# Patient Record
Sex: Female | Born: 1955 | Race: White | Hispanic: No | Marital: Married | Smoking: Never smoker
Health system: Southern US, Community
[De-identification: ages and names within clinical notes are randomized; demographics above are authoritative.]

## PROBLEM LIST (undated history)

## (undated) DIAGNOSIS — A630 Anogenital (venereal) warts: Secondary | ICD-10-CM

## (undated) HISTORY — DX: Anogenital (venereal) warts: A63.0

---
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
## 2001-07-26 ENCOUNTER — Other Ambulatory Visit: Admission: RE | Admit: 2001-07-26 | Discharge: 2001-07-26 | Payer: Self-pay | Admitting: Internal Medicine

## 2002-08-03 ENCOUNTER — Other Ambulatory Visit: Admission: RE | Admit: 2002-08-03 | Discharge: 2002-08-03 | Payer: Self-pay | Admitting: Internal Medicine

## 2002-08-11 ENCOUNTER — Encounter: Admission: RE | Admit: 2002-08-11 | Discharge: 2002-08-11 | Payer: Self-pay | Admitting: Internal Medicine

## 2002-08-11 ENCOUNTER — Encounter: Payer: Self-pay | Admitting: Internal Medicine

## 2004-03-10 ENCOUNTER — Encounter: Admission: RE | Admit: 2004-03-10 | Discharge: 2004-03-10 | Payer: Self-pay | Admitting: Internal Medicine

## 2005-03-23 ENCOUNTER — Encounter: Admission: RE | Admit: 2005-03-23 | Discharge: 2005-03-23 | Payer: Self-pay | Admitting: Internal Medicine

## 2006-04-27 ENCOUNTER — Encounter: Admission: RE | Admit: 2006-04-27 | Discharge: 2006-04-27 | Payer: Self-pay | Admitting: Internal Medicine

## 2007-05-02 ENCOUNTER — Encounter: Admission: RE | Admit: 2007-05-02 | Discharge: 2007-05-02 | Payer: Self-pay | Admitting: Internal Medicine

## 2008-05-02 ENCOUNTER — Encounter: Admission: RE | Admit: 2008-05-02 | Discharge: 2008-05-02 | Payer: Self-pay | Admitting: Internal Medicine

## 2009-04-11 DIAGNOSIS — E559 Vitamin D deficiency, unspecified: Secondary | ICD-10-CM | POA: Insufficient documentation

## 2009-04-11 DIAGNOSIS — E7800 Pure hypercholesterolemia, unspecified: Secondary | ICD-10-CM | POA: Insufficient documentation

## 2009-05-03 ENCOUNTER — Encounter: Admission: RE | Admit: 2009-05-03 | Discharge: 2009-05-03 | Payer: Self-pay | Admitting: Internal Medicine

## 2010-05-05 ENCOUNTER — Encounter: Admission: RE | Admit: 2010-05-05 | Discharge: 2010-05-05 | Payer: Self-pay | Admitting: Internal Medicine

## 2011-04-03 ENCOUNTER — Other Ambulatory Visit: Payer: Self-pay | Admitting: Internal Medicine

## 2011-04-03 DIAGNOSIS — Z1231 Encounter for screening mammogram for malignant neoplasm of breast: Secondary | ICD-10-CM

## 2011-05-07 ENCOUNTER — Ambulatory Visit
Admission: RE | Admit: 2011-05-07 | Discharge: 2011-05-07 | Disposition: A | Discharge status: Home / Self Care | Payer: 59 | Source: Ambulatory Visit | Attending: Internal Medicine | Admitting: Internal Medicine

## 2011-05-07 DIAGNOSIS — Z1231 Encounter for screening mammogram for malignant neoplasm of breast: Secondary | ICD-10-CM

## 2012-03-29 ENCOUNTER — Other Ambulatory Visit: Payer: Self-pay | Admitting: Internal Medicine

## 2012-03-29 DIAGNOSIS — Z1231 Encounter for screening mammogram for malignant neoplasm of breast: Secondary | ICD-10-CM

## 2012-05-09 ENCOUNTER — Ambulatory Visit
Admission: RE | Admit: 2012-05-09 | Discharge: 2012-05-09 | Disposition: A | Discharge status: Home / Self Care | Payer: 59 | Source: Ambulatory Visit | Attending: Internal Medicine | Admitting: Internal Medicine

## 2012-05-09 ENCOUNTER — Ambulatory Visit: Payer: 59

## 2012-05-09 DIAGNOSIS — Z1231 Encounter for screening mammogram for malignant neoplasm of breast: Secondary | ICD-10-CM

## 2013-04-03 ENCOUNTER — Other Ambulatory Visit: Payer: Self-pay

## 2013-04-03 DIAGNOSIS — Z1231 Encounter for screening mammogram for malignant neoplasm of breast: Secondary | ICD-10-CM

## 2013-05-10 ENCOUNTER — Ambulatory Visit
Admission: RE | Admit: 2013-05-10 | Discharge: 2013-05-10 | Disposition: A | Discharge status: Home / Self Care | Payer: 59 | Source: Ambulatory Visit

## 2013-05-10 DIAGNOSIS — Z1231 Encounter for screening mammogram for malignant neoplasm of breast: Secondary | ICD-10-CM

## 2014-04-03 ENCOUNTER — Other Ambulatory Visit: Payer: Self-pay

## 2014-04-03 DIAGNOSIS — Z1231 Encounter for screening mammogram for malignant neoplasm of breast: Secondary | ICD-10-CM

## 2014-05-11 ENCOUNTER — Encounter (INDEPENDENT_AMBULATORY_CARE_PROVIDER_SITE_OTHER): Payer: Self-pay

## 2014-05-11 ENCOUNTER — Ambulatory Visit
Admission: RE | Admit: 2014-05-11 | Discharge: 2014-05-11 | Disposition: A | Discharge status: Home / Self Care | Payer: 59 | Source: Ambulatory Visit

## 2014-05-11 DIAGNOSIS — Z1231 Encounter for screening mammogram for malignant neoplasm of breast: Secondary | ICD-10-CM

## 2015-04-11 ENCOUNTER — Other Ambulatory Visit: Payer: Self-pay

## 2015-04-11 DIAGNOSIS — Z1231 Encounter for screening mammogram for malignant neoplasm of breast: Secondary | ICD-10-CM

## 2015-05-13 ENCOUNTER — Ambulatory Visit
Admission: RE | Admit: 2015-05-13 | Discharge: 2015-05-13 | Disposition: A | Discharge status: Home / Self Care | Payer: 59 | Source: Ambulatory Visit

## 2015-05-13 DIAGNOSIS — Z1231 Encounter for screening mammogram for malignant neoplasm of breast: Secondary | ICD-10-CM

## 2015-07-02 DIAGNOSIS — R85612 Low grade squamous intraepithelial lesion on cytologic smear of anus (LGSIL): Secondary | ICD-10-CM | POA: Insufficient documentation

## 2016-04-08 ENCOUNTER — Other Ambulatory Visit: Payer: Self-pay | Admitting: Internal Medicine

## 2016-04-08 DIAGNOSIS — Z1231 Encounter for screening mammogram for malignant neoplasm of breast: Secondary | ICD-10-CM

## 2016-05-25 ENCOUNTER — Ambulatory Visit
Admission: RE | Admit: 2016-05-25 | Discharge: 2016-05-25 | Disposition: A | Discharge status: Home / Self Care | Payer: 59 | Source: Ambulatory Visit | Attending: Internal Medicine | Admitting: Internal Medicine

## 2016-05-25 DIAGNOSIS — Z1231 Encounter for screening mammogram for malignant neoplasm of breast: Secondary | ICD-10-CM

## 2016-10-27 ENCOUNTER — Encounter: Payer: Self-pay | Admitting: Obstetrics & Gynecology

## 2016-11-06 ENCOUNTER — Ambulatory Visit (INDEPENDENT_AMBULATORY_CARE_PROVIDER_SITE_OTHER): Payer: BLUE CROSS/BLUE SHIELD | Admitting: Obstetrics & Gynecology

## 2016-11-06 ENCOUNTER — Encounter: Payer: Self-pay | Admitting: Obstetrics & Gynecology

## 2016-11-06 VITALS — BP 136/82 | Ht 64.0 in | Wt 139.0 lb

## 2016-11-06 DIAGNOSIS — R87611 Atypical squamous cells cannot exclude high grade squamous intraepithelial lesion on cytologic smear of cervix (ASC-H): Secondary | ICD-10-CM | POA: Diagnosis not present

## 2016-11-06 DIAGNOSIS — N952 Postmenopausal atrophic vaginitis: Secondary | ICD-10-CM

## 2016-11-06 DIAGNOSIS — Z01411 Encounter for gynecological examination (general) (routine) with abnormal findings: Secondary | ICD-10-CM

## 2016-11-06 NOTE — Patient Instructions (Signed)
Normal [facility]/Gyn exam except Atrophic vaginitis due to Menopause.  KY as needed.  Pap reflex pending. If pap wnl, repeat in 1 year at [facility]/Gyn exam with me.  Will repeat screening Mammo 04/2017.

## 2016-11-06 NOTE — Addendum Note (Signed)
Addended by: Thurnell Garbe A on: 11/06/2016 10:06 AM   Modules accepted: Orders

## 2016-11-06 NOTE — Progress Notes (Signed)
    Karina Wise 25-Jun-1956 [id number]   History:    61 y.o.  for annual gyn exam, established patient G0 Married, retired.  Menopause.  No HRT.  No PMB.  No pelvic pain.  H/O ASCUS/HGSIL not r/o with HR HPV pos.  Colpo 09/2015 No dysplasia.  Last pap wnl/HPV HR neg 06/2016.  Vaginal dryness, but declines Estrogen therapy.  Breasts wnl.    Past medical history,surgical history, family history and social history were all reviewed and documented in the EPIC chart.  Gynecologic History No LMP recorded. Patient is postmenopausal. Contraception: post menopausal status Last Pap: 06/2016. Results were: normal Last mammogram: 04/2016. Results were: normal  Obstetric History OB History  Gravida Para Term Preterm AB Living  0 0 0 0 0 0  SAB TAB Ectopic Multiple Live Births  0 0 0 0 0         ROS: A ROS was performed and pertinent positives and negatives are included in the history.  GENERAL: No fevers or chills. HEENT: No change in vision, no earache, sore throat or sinus congestion. NECK: No pain or stiffness. CARDIOVASCULAR: No chest pain or pressure. No palpitations. PULMONARY: No shortness of breath, cough or wheeze. GASTROINTESTINAL: No abdominal pain, nausea, vomiting or diarrhea, melena or bright red blood per rectum. GENITOURINARY: No urinary frequency, urgency, hesitancy or dysuria. MUSCULOSKELETAL: No joint or muscle pain, no back pain, no recent trauma. DERMATOLOGIC: No rash, no itching, no lesions. ENDOCRINE: No polyuria, polydipsia, no heat or cold intolerance. No recent change in weight. HEMATOLOGICAL: No anemia or easy bruising or bleeding. NEUROLOGIC: No headache, seizures, numbness, tingling or weakness. PSYCHIATRIC: No depression, no loss of interest in normal activity or change in sleep pattern.     Exam:   BP 136/82   Ht 5\' 4"  (1.626 m)   Wt 139 lb (63 kg)   BMI 23.86 kg/m   Body mass index is 23.86 kg/m.  General appearance : Well developed well nourished female.  No acute distress HEENT: Eyes: no retinal hemorrhage or exudates,  Neck supple, trachea midline, no carotid bruits, no thyroidmegaly Lungs: Clear to auscultation, no rhonchi or wheezes, or rib retractions  Heart: Regular rate and rhythm, no murmurs or gallops Breast:Examined in sitting and supine position were symmetrical in appearance, no palpable masses or tenderness,  no skin retraction, no nipple inversion, no nipple discharge, no skin discoloration, no axillary or supraclavicular lymphadenopathy Abdomen: no palpable masses or tenderness, no rebound or guarding Extremities: no edema or skin discoloration or tenderness  Pelvic:  Bartholin, Urethra, Skene Glands: Within normal limits             Vagina: No gross lesions or discharge  Cervix: No gross lesions or discharge  Uterus  AV, normal size, shape and consistency, non-tender and mobile  Adnexa  Without masses or tenderness  Anus and perineum  normal      Assessment/Plan:  61 y.o. female for annual exam.  1. Encounter for gynecological examination with abnormal finding Atrophic Vaginitis.  Declines Estrogen therapy.  KY gel PRN.  Schedule Mammo 04/2017.  2. Post-menopausal atrophic vaginitis KY gel PRN.  3. Cytologic smear of cervix showing atypical squamous cells, cannot exclude high grade squamous intraepithelial lesion (HGSIL) Last pap wnl/HPV HR neg 06/2016.  If pap wnl today, will repeat at [facility] in 1 yr.  Pap Reflex today.  Counseling on above problems >50% x 15 min.  Princess Bruins MD, 9:31 AM 11/06/2016

## 2016-11-10 ENCOUNTER — Encounter: Payer: Self-pay | Admitting: Obstetrics & Gynecology

## 2016-11-10 LAB — PAP IG W/ RFLX HPV ASCU

## 2017-04-23 ENCOUNTER — Other Ambulatory Visit: Payer: Self-pay | Admitting: Internal Medicine

## 2017-04-23 DIAGNOSIS — Z1231 Encounter for screening mammogram for malignant neoplasm of breast: Secondary | ICD-10-CM

## 2017-05-26 ENCOUNTER — Ambulatory Visit
Admission: RE | Admit: 2017-05-26 | Discharge: 2017-05-26 | Disposition: A | Discharge status: Home / Self Care | Payer: BLUE CROSS/BLUE SHIELD | Source: Ambulatory Visit | Attending: Internal Medicine | Admitting: Internal Medicine

## 2017-05-26 DIAGNOSIS — Z1231 Encounter for screening mammogram for malignant neoplasm of breast: Secondary | ICD-10-CM

## 2017-11-09 ENCOUNTER — Ambulatory Visit (INDEPENDENT_AMBULATORY_CARE_PROVIDER_SITE_OTHER): Payer: BLUE CROSS/BLUE SHIELD | Admitting: Obstetrics & Gynecology

## 2017-11-09 ENCOUNTER — Encounter: Payer: Self-pay | Admitting: Obstetrics & Gynecology

## 2017-11-09 VITALS — BP 132/84 | Ht 64.0 in | Wt 139.0 lb

## 2017-11-09 DIAGNOSIS — Z13820 Encounter for screening for osteoporosis: Secondary | ICD-10-CM

## 2017-11-09 DIAGNOSIS — Z780 Asymptomatic menopausal state: Secondary | ICD-10-CM | POA: Diagnosis not present

## 2017-11-09 DIAGNOSIS — Z01419 Encounter for gynecological examination (general) (routine) without abnormal findings: Secondary | ICD-10-CM | POA: Diagnosis not present

## 2017-11-09 NOTE — Patient Instructions (Signed)
1. Encounter for routine gynecological examination with Papanicolaou smear of cervix Normal gynecologic exam.  Pap reflex done.  Breasts normal.  Last screening mammogram was negative October 2018.  Colonoscopy 2018.  Health labs with family physician.  Continue with regular physical activity. - Pap IG w/ reflex to HPV when ASC-U  2. Menopause present Well on no hormone replacement therapy.  No postmenopausal bleeding.  Abstinent.  3. Screening for osteoporosis Vitamin D supplements, calcium rich nutrition and regular weightbearing physical activity recommended.  Scheduling bone density here now. - DG Bone Density; Future   Karina Wise, it was a pleasure seeing you today!  I will inform you of your results as soon as they are available.   Preventing Osteoporosis, Adult Osteoporosis is a condition that causes the bones to get weaker. With osteoporosis, the bones become thinner, and the normal spaces in bone tissue become larger. This can make the bones weak and cause them to break more easily. People who have osteoporosis are more likely to break their wrist, spine, or hip. Even a minor accident or injury can be enough to break weak bones. Osteoporosis can occur with aging. Your body constantly replaces old bone tissue with new tissue. As you get older, you may lose bone tissue more quickly, or it may be replaced more slowly. Osteoporosis is more likely to develop if you have poor nutrition or do not get enough calcium or vitamin D. Other lifestyle factors can also play a role. By making some diet and lifestyle changes, you can help to keep your bones healthy and help to prevent osteoporosis. What nutrition changes can be made? Nutrition plays an important role in maintaining healthy, strong bones.  Make sure you get enough calcium every day from food or from calcium supplements. ? If you are age 37 or younger, aim to get 1,000 mg of calcium every day. ? If you are older than age 63, aim to get 1,200  mg of calcium every day.  Try to get enough vitamin D every day. ? If you are age 53 or younger, aim to get 600 international units (IU) every day. ? If you are older than age 102, aim to get 800 international units every day.  Follow a healthy diet. Eat plenty of foods that contain calcium and vitamin D. ? Calcium is in milk, cheese, yogurt, and other dairy products. Some fish and vegetables are also good sources of calcium. Many foods such as cereals and breads have had calcium added to them (are fortified). Check nutrition labels to see how much calcium is in a food or drink. ? Foods that contain vitamin D include milk, cereals, salmon, and tuna. Your body also makes vitamin D when you are out in the sun. Bare skin exposure to the sun on your face, arms, legs, or back for no more than 30 minutes a day, 2 times per week is more than enough. Beyond that, it is important to use sunblock to protect your skin from sunburn, which increases your risk for skin cancer.  What lifestyle changes can be made? Making changes in your everyday life can also play an important role in preventing osteoporosis.  Stay active and get exercise every day. Ask your health care provider what types of exercise are best for you.  Do not use any products that contain nicotine or tobacco, such as cigarettes and e-cigarettes. If you need help quitting, ask your health care provider.  Limit alcohol intake to no more than 1 drink  a day for nonpregnant women and 2 drinks a day for men. One drink equals 12 oz of beer, 5 oz of wine, or 1 oz of hard liquor.  Why are these changes important? Making these nutrition and lifestyle changes can:  Help you develop and maintain healthy, strong bones.  Prevent loss of bone mass and the problems that are caused by that loss, such as broken bones and delayed healing.  Make you feel better mentally and physically.  What can happen if changes are not made? Problems that can result  from osteoporosis can be very serious. These may include:  A higher risk of broken bones that are painful and do not heal well.  Physical malformations, such as a collapsed spine or a hunched back.  Problems with movement.  Where to find support: If you need help making changes to prevent osteoporosis, talk with your health care provider. You can ask for a referral to a diet and nutrition specialist (dietitian) and a physical therapist. Where to find more information: Learn more about osteoporosis from:  NIH Osteoporosis and Related [place]: www.niams.GolfingGoddess.com.br  U.S. Office on Women's Health: SouvenirBaseball.es.html  National Osteoporosis Foundation: ProfilePeek.ch  Summary  Osteoporosis is a condition that causes weak bones that are more likely to break.  Eating a healthy diet and making sure you get enough calcium and vitamin D can help prevent osteoporosis.  Other ways to reduce your risk of osteoporosis include getting regular exercise and avoiding alcohol and products that contain nicotine or tobacco. This information is not intended to replace advice given to you by your health care provider. Make sure you discuss any questions you have with your health care provider. Document Released: 07/28/2015 Document Revised: 03/23/2016 Document Reviewed: 03/23/2016 Elsevier Interactive Patient Education  2018 [place] protect organs, store calcium, and anchor muscles. Good health habits, such as eating nutritious foods and exercising regularly, are important for maintaining healthy bones. They can also help to prevent a condition that causes bones to lose density and become weak and brittle (osteoporosis). Why is bone mass important? Bone mass refers to the amount of bone tissue that you have. The  higher your bone mass, the stronger your bones. An important step toward having healthy bones throughout life is to have strong and dense bones during childhood. A young adult who has a high bone mass is more likely to have a high bone mass later in life. Bone mass at its greatest it is called peak bone mass. A large decline in bone mass occurs in older adults. In women, it occurs about the time of menopause. During this time, it is important to practice good health habits, because if more bone is lost than what is replaced, the bones will become less healthy and more likely to break (fracture). If you find that you have a low bone mass, you may be able to prevent osteoporosis or further bone loss by changing your diet and lifestyle. How can I find out if my bone mass is low? Bone mass can be measured with an X-ray test that is called a bone mineral density (BMD) test. This test is recommended for all women who are age 52 or older. It may also be recommended for men who are age 76 or older, or for people who are more likely to develop osteoporosis due to:  Having bones that break easily.  Having a long-term disease that weakens bones, such as kidney disease or  rheumatoid arthritis.  Having menopause earlier than normal.  Taking medicine that weakens bones, such as steroids, thyroid hormones, or hormone treatment for breast cancer or prostate cancer.  Smoking.  Drinking three or more alcoholic drinks each day.  What are the nutritional recommendations for healthy bones? To have healthy bones, you need to get enough of the right minerals and vitamins. Most nutrition experts recommend getting these nutrients from the foods that you eat. Nutritional recommendations vary from person to person. Ask your health care provider what is healthy for you. Here are some general guidelines. Calcium Recommendations Calcium is the most important (essential) mineral for bone health. Most people can get enough  calcium from their diet, but supplements may be recommended for people who are at risk for osteoporosis. Good sources of calcium include:  Dairy products, such as low-fat or nonfat milk, cheese, and yogurt.  Dark green leafy vegetables, such as bok choy and broccoli.  Calcium-fortified foods, such as orange juice, cereal, bread, soy beverages, and tofu products.  Nuts, such as almonds.  Follow these recommended amounts for daily calcium intake:  Children, age 50?3: 700 mg.  Children, age 31?8: 1,000 mg.  Children, age 319?13: 1,300 mg.  Teens, age 23?18: 1,300 mg.  Adults, age 23?50: 1,000 mg.  Adults, age 32?70: ? Men: 1,000 mg. ? Women: 1,200 mg.  Adults, age 67 or older: 1,200 mg.  Pregnant and breastfeeding females: ? Teens: 1,300 mg. ? Adults: 1,000 mg.  Vitamin D Recommendations Vitamin D is the most essential vitamin for bone health. It helps the body to absorb calcium. Sunlight stimulates the skin to make vitamin D, so be sure to get enough sunlight. If you live in a cold climate or you do not get outside often, your health care provider may recommend that you take vitamin D supplements. Good sources of vitamin D in your diet include:  Egg yolks.  Saltwater fish.  Milk and cereal fortified with vitamin D.  Follow these recommended amounts for daily vitamin D intake:  Children and teens, age 9?18: 90 international units.  Adults, age 312 or younger: 400-800 international units.  Adults, age 310 or older: 800-1,000 international units.  Other Nutrients Other nutrients for bone health include:  Phosphorus. This mineral is found in meat, poultry, dairy foods, nuts, and legumes. The recommended daily intake for adult men and adult women is 700 mg.  Magnesium. This mineral is found in seeds, nuts, dark green vegetables, and legumes. The recommended daily intake for adult men is 400?420 mg. For adult women, it is 310?320 mg.  Vitamin K. This vitamin is found in  green leafy vegetables. The recommended daily intake is 120 mg for adult men and 90 mg for adult women.  What type of physical activity is best for building and maintaining healthy bones? Weight-bearing and strength-building activities are important for building and maintaining peak bone mass. Weight-bearing activities cause muscles and bones to work against gravity. Strength-building activities increases muscle strength that supports bones. Weight-bearing and muscle-building activities include:  Walking and hiking.  Jogging and running.  Dancing.  Gym exercises.  Lifting weights.  Tennis and racquetball.  Climbing stairs.  Aerobics.  Adults should get at least 30 minutes of moderate physical activity on most days. Children should get at least 60 minutes of moderate physical activity on most days. Ask your health care provide what type of exercise is best for you. Where can I find more information? For more information, check out the following websites:  National Osteoporosis Foundation: YardHomes.se  [employer] of Health: http://www.niams.AnonymousEar.fr.asp  This information is not intended to replace advice given to you by your health care provider. Make sure you discuss any questions you have with your health care provider. Document Released: 10/03/2003 Document Revised: 01/31/2016 Document Reviewed: 07/18/2014 Elsevier Interactive Patient Education  [employer].

## 2017-11-09 NOTE — Progress Notes (Signed)
Karina Wise 19-Jun-1956 [id number]   History:    62 y.o. G0 Married.  Both retired.  Husband has Prostate Cancer. Patient's mother died last year.  RP:  Established patient presenting for annual gyn exam   HPI: Menopause, well without hormone replacement therapy.  No postmenopausal bleeding.  No pelvic pain.  Normal vaginal secretions.  Abstinent.  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  Colposcopy was negative, no dysplasia, in 09/2015.  A repeat Pap test was negative 10/2016.  Urine and bowel movements normal.  Body mass index 23.86.  Walks regularly and plays golf.  Breasts normal.  Health labs with family physician.  Past medical history,surgical history, family history and social history were all reviewed and documented in the EPIC chart.  Gynecologic History No LMP recorded. Patient is postmenopausal. Contraception: abstinence and post menopausal status Last Pap: 10/2016. Results were: Negative Last mammogram: 04/2017. Results were: Negative Bone Density: Will schedule here now Colonoscopy: 02/2017  Obstetric History OB History  Gravida Para Term Preterm AB Living  0 0 0 0 0 0  SAB TAB Ectopic Multiple Live Births  0 0 0 0 0     ROS: A ROS was performed and pertinent positives and negatives are included in the history.  GENERAL: No fevers or chills. HEENT: No change in vision, no earache, sore throat or sinus congestion. NECK: No pain or stiffness. CARDIOVASCULAR: No chest pain or pressure. No palpitations. PULMONARY: No shortness of breath, cough or wheeze. GASTROINTESTINAL: No abdominal pain, nausea, vomiting or diarrhea, melena or bright red blood per rectum. GENITOURINARY: No urinary frequency, urgency, hesitancy or dysuria. MUSCULOSKELETAL: No joint or muscle pain, no back pain, no recent trauma. DERMATOLOGIC: No rash, no itching, no lesions. ENDOCRINE: No polyuria, polydipsia, no heat or cold intolerance. No recent change in weight.  HEMATOLOGICAL: No anemia or easy bruising or bleeding. NEUROLOGIC: No headache, seizures, numbness, tingling or weakness. PSYCHIATRIC: No depression, no loss of interest in normal activity or change in sleep pattern.     Exam:   BP 132/84   Ht 5\' 4"  (1.626 m)   Wt 139 lb (63 kg)   BMI 23.86 kg/m   Body mass index is 23.86 kg/m.  General appearance : Well developed well nourished female. No acute distress HEENT: Eyes: no retinal hemorrhage or exudates,  Neck supple, trachea midline, no carotid bruits, no thyroidmegaly Lungs: Clear to auscultation, no rhonchi or wheezes, or rib retractions  Heart: Regular rate and rhythm, no murmurs or gallops Breast:Examined in sitting and supine position were symmetrical in appearance, no palpable masses or tenderness,  no skin retraction, no nipple inversion, no nipple discharge, no skin discoloration, no axillary or supraclavicular lymphadenopathy Abdomen: no palpable masses or tenderness, no rebound or guarding Extremities: no edema or skin discoloration or tenderness  Pelvic: Vulva: Normal             Vagina: No gross lesions or discharge  Cervix: No gross lesions or discharge.  Pap reflex done.  Uterus  AV, normal size, shape and consistency, non-tender and mobile  Adnexa  Without masses or tenderness  Anus: Normal   Assessment/Plan:  62 y.o. female for annual exam   1. Encounter for routine gynecological examination with Papanicolaou smear of cervix Normal gynecologic exam.  Pap reflex done.  Breasts normal.  Last screening mammogram was negative October 2018.  Colonoscopy 2018.  Health labs with family physician.  Continue with regular physical activity. -  Pap IG w/ reflex to HPV when ASC-U  2. Menopause present Well on no hormone replacement therapy.  No postmenopausal bleeding.  Abstinent.  3. Screening for osteoporosis Vitamin D supplements, calcium rich nutrition and regular weightbearing physical activity recommended.  Scheduling  bone density here now. - DG Bone Density; Future   Princess Bruins MD, 8:44 AM 11/09/2017

## 2017-11-10 LAB — PAP IG W/ RFLX HPV ASCU

## 2017-11-11 ENCOUNTER — Encounter: Payer: Self-pay | Admitting: *Deleted

## 2018-04-18 ENCOUNTER — Other Ambulatory Visit: Payer: Self-pay | Admitting: Internal Medicine

## 2018-04-18 DIAGNOSIS — Z1231 Encounter for screening mammogram for malignant neoplasm of breast: Secondary | ICD-10-CM

## 2018-05-27 ENCOUNTER — Ambulatory Visit
Admission: RE | Admit: 2018-05-27 | Discharge: 2018-05-27 | Disposition: A | Discharge status: Home / Self Care | Payer: BLUE CROSS/BLUE SHIELD | Source: Ambulatory Visit | Attending: Internal Medicine | Admitting: Internal Medicine

## 2018-05-27 DIAGNOSIS — Z1231 Encounter for screening mammogram for malignant neoplasm of breast: Secondary | ICD-10-CM

## 2018-07-27 HISTORY — PX: BREAST BIOPSY: SHX20

## 2018-11-14 ENCOUNTER — Other Ambulatory Visit: Payer: Self-pay

## 2018-11-15 ENCOUNTER — Encounter: Payer: Self-pay | Admitting: Obstetrics & Gynecology

## 2018-11-15 ENCOUNTER — Ambulatory Visit (INDEPENDENT_AMBULATORY_CARE_PROVIDER_SITE_OTHER): Payer: Medicare HMO | Admitting: Obstetrics & Gynecology

## 2018-11-15 VITALS — BP 122/80 | Ht 64.0 in | Wt 137.2 lb

## 2018-11-15 DIAGNOSIS — Z9189 Other specified personal risk factors, not elsewhere classified: Secondary | ICD-10-CM

## 2018-11-15 DIAGNOSIS — Z01419 Encounter for gynecological examination (general) (routine) without abnormal findings: Secondary | ICD-10-CM

## 2018-11-15 DIAGNOSIS — Z13820 Encounter for screening for osteoporosis: Secondary | ICD-10-CM

## 2018-11-15 DIAGNOSIS — R87810 Cervical high risk human papillomavirus (HPV) DNA test positive: Secondary | ICD-10-CM | POA: Diagnosis not present

## 2018-11-15 DIAGNOSIS — B977 Papillomavirus as the cause of diseases classified elsewhere: Secondary | ICD-10-CM

## 2018-11-15 DIAGNOSIS — Z8742 Personal history of other diseases of the female genital tract: Secondary | ICD-10-CM

## 2018-11-15 DIAGNOSIS — Z780 Asymptomatic menopausal state: Secondary | ICD-10-CM

## 2018-11-15 DIAGNOSIS — R69 Illness, unspecified: Secondary | ICD-10-CM | POA: Diagnosis not present

## 2018-11-15 DIAGNOSIS — R87610 Atypical squamous cells of undetermined significance on cytologic smear of cervix (ASC-US): Secondary | ICD-10-CM | POA: Diagnosis not present

## 2018-11-15 NOTE — Progress Notes (Signed)
Karina Wise [birth year] [id number]   History:    63 y.o. G0 Married  RP:  Established patient presenting for annual gyn exam   HPI:  Post-menopause, well on no HRT.  No PMB.  No pelvic pain.  Abstinent, husband with Prostate Cancer on Lupron.  Urine/BMs normal.  Breasts normal.  BMI 23.55.  Fit, walking and golfing. Health labs with Dr Osborne Casco.  Will schedule Colono.    Past medical history,surgical history, family history and social history were all reviewed and documented in the EPIC chart.  Gynecologic History No LMP recorded. Patient is postmenopausal. Contraception: abstinence and post menopausal status Last Pap: 10/2017. Results were: Negative Last mammogram: 05/2018. Results were: Negative Bone Density: Schedule here now Colonoscopy: Will schedule through her Fam MD  Obstetric History OB History  Gravida Para Term Preterm AB Living  0 0 0 0 0 0  SAB TAB Ectopic Multiple Live Births  0 0 0 0 0     ROS: A ROS was performed and pertinent positives and negatives are included in the history.  GENERAL: No fevers or chills. HEENT: No change in vision, no earache, sore throat or sinus congestion. NECK: No pain or stiffness. CARDIOVASCULAR: No chest pain or pressure. No palpitations. PULMONARY: No shortness of breath, cough or wheeze. GASTROINTESTINAL: No abdominal pain, nausea, vomiting or diarrhea, melena or bright red blood per rectum. GENITOURINARY: No urinary frequency, urgency, hesitancy or dysuria. MUSCULOSKELETAL: No joint or muscle pain, no back pain, no recent trauma. DERMATOLOGIC: No rash, no itching, no lesions. ENDOCRINE: No polyuria, polydipsia, no heat or cold intolerance. No recent change in weight. HEMATOLOGICAL: No anemia or easy bruising or bleeding. NEUROLOGIC: No headache, seizures, numbness, tingling or weakness. PSYCHIATRIC: No depression, no loss of interest in normal activity or change in sleep pattern.     Exam:   BP 122/80   Ht 5\' 4"  (1.626 m)   Wt  137 lb 3.2 oz (62.2 kg)   BMI 23.55 kg/m   Body mass index is 23.55 kg/m.  General appearance : Well developed well nourished female. No acute distress HEENT: Eyes: no retinal hemorrhage or exudates,  Neck supple, trachea midline, no carotid bruits, no thyroidmegaly Lungs: Clear to auscultation, no rhonchi or wheezes, or rib retractions  Heart: Regular rate and rhythm, no murmurs or gallops Breast:Examined in sitting and supine position were symmetrical in appearance, no palpable masses or tenderness,  no skin retraction, no nipple inversion, no nipple discharge, no skin discoloration, no axillary or supraclavicular lymphadenopathy Abdomen: no palpable masses or tenderness, no rebound or guarding Extremities: no edema or skin discoloration or tenderness  Pelvic: Vulva: Normal             Vagina: No gross lesions or discharge  Cervix: No gross lesions or discharge.  Pap/HPV HR done.  Uterus  AV, normal size, shape and consistency, non-tender and mobile  Adnexa  Without masses or tenderness  Anus: Normal   Assessment/Plan:  63 y.o. female for annual exam   1. Encounter for routine gynecological examination with Papanicolaou smear of cervix Normal gynecologic exam.  Pap with high-risk HPV done today because of history of abnormal Pap test with high-risk HPV positive in 2017.  Breast exam normal.  Screening mammogram November 2019 was negative.  Will schedule colonoscopy through [name].  Health labs with [name].  2. 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  3. H/O abnormal cervical Papanicolaou smear Abnormal Pap test in 2017.  4. Postmenopause Well on no hormone replacement therapy.  No postmenopausal bleeding.  5. Screening for osteoporosis Taking vitamin D supplements, calcium intake of 1200 to 1500 mg daily, regular weightbearing physical activities.  Will schedule first bone density here now. - DG Bone Density; Future  Other orders - cholecalciferol  (VITAMIN D3) 25 MCG (1000 UT) tablet; Take 1,000 Units by mouth daily. - B Complex Vitamins (VITAMIN B COMPLEX PO); Take by mouth.  Princess Bruins MD, 9:13 AM 11/15/2018

## 2018-11-15 NOTE — Patient Instructions (Signed)
1. Encounter for routine gynecological examination with Papanicolaou smear of cervix Normal gynecologic exam.  Pap with high-risk HPV done today because of history of abnormal Pap test with high-risk HPV positive in 2017.  Breast exam normal.  Screening mammogram November 2019 was negative.  Will schedule colonoscopy through [name].  Health labs with [name].  2. 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  3. H/O abnormal cervical Papanicolaou smear Abnormal Pap test in 2017.  4. Postmenopause Well on no hormone replacement therapy.  No postmenopausal bleeding.  5. Screening for osteoporosis Taking vitamin D supplements, calcium intake of 1200 to 1500 mg daily, regular weightbearing physical activities.  Will schedule first bone density here now. - DG Bone Density; Future  Other orders - cholecalciferol (VITAMIN D3) 25 MCG (1000 UT) tablet; Take 1,000 Units by mouth daily. - B Complex Vitamins (VITAMIN B COMPLEX PO); Take by mouth.  Karina Wise, it was a pleasure seeing you today!  I will inform you of your results as soon as they are available.

## 2018-11-16 LAB — PAP IG AND HPV HIGH-RISK: HPV DNA High Risk: NOT DETECTED

## 2018-11-17 ENCOUNTER — Encounter: Payer: Self-pay | Admitting: *Deleted

## 2019-03-15 DIAGNOSIS — R69 Illness, unspecified: Secondary | ICD-10-CM | POA: Diagnosis not present

## 2019-04-13 ENCOUNTER — Other Ambulatory Visit: Payer: Self-pay | Admitting: Internal Medicine

## 2019-04-13 DIAGNOSIS — Z1231 Encounter for screening mammogram for malignant neoplasm of breast: Secondary | ICD-10-CM

## 2019-05-01 DIAGNOSIS — R69 Illness, unspecified: Secondary | ICD-10-CM | POA: Diagnosis not present

## 2019-05-31 ENCOUNTER — Other Ambulatory Visit: Payer: Self-pay

## 2019-05-31 ENCOUNTER — Ambulatory Visit
Admission: RE | Admit: 2019-05-31 | Discharge: 2019-05-31 | Disposition: A | Discharge status: Home / Self Care | Payer: Medicare HMO | Source: Ambulatory Visit | Attending: Internal Medicine | Admitting: Internal Medicine

## 2019-05-31 DIAGNOSIS — Z1231 Encounter for screening mammogram for malignant neoplasm of breast: Secondary | ICD-10-CM | POA: Diagnosis not present

## 2019-06-01 ENCOUNTER — Other Ambulatory Visit: Payer: Self-pay | Admitting: Internal Medicine

## 2019-06-01 DIAGNOSIS — R928 Other abnormal and inconclusive findings on diagnostic imaging of breast: Secondary | ICD-10-CM

## 2019-06-08 ENCOUNTER — Other Ambulatory Visit: Payer: Self-pay | Admitting: Internal Medicine

## 2019-06-08 ENCOUNTER — Ambulatory Visit
Admission: RE | Admit: 2019-06-08 | Discharge: 2019-06-08 | Disposition: A | Discharge status: Home / Self Care | Payer: Medicare HMO | Source: Ambulatory Visit | Attending: Internal Medicine | Admitting: Internal Medicine

## 2019-06-08 ENCOUNTER — Other Ambulatory Visit: Payer: Self-pay

## 2019-06-08 DIAGNOSIS — N6489 Other specified disorders of breast: Secondary | ICD-10-CM

## 2019-06-08 DIAGNOSIS — R928 Other abnormal and inconclusive findings on diagnostic imaging of breast: Secondary | ICD-10-CM

## 2019-06-08 DIAGNOSIS — R922 Inconclusive mammogram: Secondary | ICD-10-CM | POA: Diagnosis not present

## 2019-06-14 ENCOUNTER — Ambulatory Visit
Admission: RE | Admit: 2019-06-14 | Discharge: 2019-06-14 | Disposition: A | Discharge status: Home / Self Care | Payer: Medicare HMO | Source: Ambulatory Visit | Attending: Internal Medicine | Admitting: Internal Medicine

## 2019-06-14 ENCOUNTER — Other Ambulatory Visit: Payer: Self-pay

## 2019-06-14 DIAGNOSIS — N6489 Other specified disorders of breast: Secondary | ICD-10-CM | POA: Diagnosis not present

## 2019-06-14 DIAGNOSIS — N6322 Unspecified lump in the left breast, upper inner quadrant: Secondary | ICD-10-CM | POA: Diagnosis not present

## 2019-06-14 DIAGNOSIS — N6012 Diffuse cystic mastopathy of left breast: Secondary | ICD-10-CM | POA: Diagnosis not present

## 2019-11-15 ENCOUNTER — Other Ambulatory Visit: Payer: Self-pay

## 2019-11-16 ENCOUNTER — Ambulatory Visit: Payer: Medicare Other | Admitting: Obstetrics & Gynecology

## 2019-11-16 ENCOUNTER — Encounter: Payer: Self-pay | Admitting: Obstetrics & Gynecology

## 2019-11-16 VITALS — BP 150/94 | Ht 64.0 in | Wt 134.0 lb

## 2019-11-16 DIAGNOSIS — Z8742 Personal history of other diseases of the female genital tract: Secondary | ICD-10-CM | POA: Diagnosis not present

## 2019-11-16 DIAGNOSIS — Z01419 Encounter for gynecological examination (general) (routine) without abnormal findings: Secondary | ICD-10-CM | POA: Diagnosis not present

## 2019-11-16 DIAGNOSIS — Z13820 Encounter for screening for osteoporosis: Secondary | ICD-10-CM | POA: Diagnosis not present

## 2019-11-16 DIAGNOSIS — Z780 Asymptomatic menopausal state: Secondary | ICD-10-CM

## 2019-11-16 NOTE — Progress Notes (Signed)
Karina Wise 28-Feb-1956 [id number]   History:    64 y.o. G0 Married  RP:  Established patient presenting for annual gyn exam   HPI:  Post-menopause, well on no HRT.  No PMB.  No pelvic pain.  Abstinent, husband with Prostate Cancer on Lupron.  Urine/BMs normal.  Breasts normal.  BMI 23.0.  Fit, walking and golfing. Health labs with Dr Osborne Casco.  Will schedule Colono, last one 5 yrs ago.   Past medical history,surgical history, family history and social history were all reviewed and documented in the EPIC chart.  Gynecologic History No LMP recorded. Patient is postmenopausal.  Obstetric History OB History  Gravida Para Term Preterm AB Living  0 0 0 0 0 0  SAB TAB Ectopic Multiple Live Births  0 0 0 0 0     ROS: A ROS was performed and pertinent positives and negatives are included in the history.  GENERAL: No fevers or chills. HEENT: No change in vision, no earache, sore throat or sinus congestion. NECK: No pain or stiffness. CARDIOVASCULAR: No chest pain or pressure. No palpitations. PULMONARY: No shortness of breath, cough or wheeze. GASTROINTESTINAL: No abdominal pain, nausea, vomiting or diarrhea, melena or bright red blood per rectum. GENITOURINARY: No urinary frequency, urgency, hesitancy or dysuria. MUSCULOSKELETAL: No joint or muscle pain, no back pain, no recent trauma. DERMATOLOGIC: No rash, no itching, no lesions. ENDOCRINE: No polyuria, polydipsia, no heat or cold intolerance. No recent change in weight. HEMATOLOGICAL: No anemia or easy bruising or bleeding. NEUROLOGIC: No headache, seizures, numbness, tingling or weakness. PSYCHIATRIC: No depression, no loss of interest in normal activity or change in sleep pattern.     Exam:   BP (!) 150/94   Ht 5\' 4"  (1.626 m)   Wt 134 lb (60.8 kg)   BMI 23.00 kg/m   Body mass index is 23 kg/m.  General appearance : Well developed well nourished female. No acute distress HEENT: Eyes: no retinal hemorrhage or exudates,   Neck supple, trachea midline, no carotid bruits, no thyroidmegaly Lungs: Clear to auscultation, no rhonchi or wheezes, or rib retractions  Heart: Regular rate and rhythm, no murmurs or gallops Breast:Examined in sitting and supine position were symmetrical in appearance, no palpable masses or tenderness,  no skin retraction, no nipple inversion, no nipple discharge, no skin discoloration, no axillary or supraclavicular lymphadenopathy Abdomen: no palpable masses or tenderness, no rebound or guarding Extremities: no edema or skin discoloration or tenderness  Pelvic: Vulva: Normal             Vagina: No gross lesions or discharge  Cervix: No gross lesions or discharge.  Pap reflex done.  Uterus  AV, normal size, shape and consistency, non-tender and mobile  Adnexa  Without masses or tenderness  Anus: Normal   Assessment/Plan:  64 y.o. female for annual exam   1. Encounter for routine gynecological examination with Papanicolaou smear of cervix Normal gynecologic exam.  Pap reflex done.  Breast exam normal.  Bilateral diagnostic mammography November 2020.  Left breast biopsy showed fibrocystic disease.  Colonoscopy 5 years ago, will schedule a repeat colonoscopy.  Health labs with family physician.  Good body mass index at 23.  Continue with fitness and healthy nutrition.  2. H/O abnormal cervical Papanicolaou smear ASCUS with negative high-risk HPV April 2020.  3. Postmenopause Well on no hormone replacement therapy.  No postmenopausal bleeding.  4. Screening for osteoporosis Recommend vitamin D supplements, calcium intake of 1200 mg daily and regular  weightbearing physical activities.  We will schedule a bone density here now. - DG Bone Density; Future  Other orders - magnesium 30 MG tablet; Take 30 mg by mouth 2 (two) times daily.  Princess Bruins MD, 8:39 AM 11/16/2019

## 2019-11-17 ENCOUNTER — Encounter: Payer: Self-pay | Admitting: Obstetrics & Gynecology

## 2019-11-17 NOTE — Patient Instructions (Signed)
1. Encounter for routine gynecological examination with Papanicolaou smear of cervix Normal gynecologic exam.  Pap reflex done.  Breast exam normal.  Bilateral diagnostic mammography November 2020.  Left breast biopsy showed fibrocystic disease.  Colonoscopy 5 years ago, will schedule a repeat colonoscopy.  Health labs with family physician.  Good body mass index at 23.  Continue with fitness and healthy nutrition.  2. H/O abnormal cervical Papanicolaou smear ASCUS with negative high-risk HPV April 2020.  3. Postmenopause Well on no hormone replacement therapy.  No postmenopausal bleeding.  4. Screening for osteoporosis Recommend vitamin D supplements, calcium intake of 1200 mg daily and regular weightbearing physical activities.  We will schedule a bone density here now. - DG Bone Density; Future  Other orders - magnesium 30 MG tablet; Take 30 mg by mouth 2 (two) times daily.  Hebah, it was a pleasure seeing you today!  I will inform you of your results as soon as they are available.

## 2019-11-20 LAB — PAP IG W/ RFLX HPV ASCU

## 2019-11-27 ENCOUNTER — Encounter: Payer: Self-pay | Admitting: *Deleted

## 2019-12-06 ENCOUNTER — Other Ambulatory Visit: Payer: Self-pay

## 2019-12-07 ENCOUNTER — Ambulatory Visit (INDEPENDENT_AMBULATORY_CARE_PROVIDER_SITE_OTHER): Payer: Medicare Other

## 2019-12-07 ENCOUNTER — Other Ambulatory Visit: Payer: Self-pay | Admitting: Obstetrics & Gynecology

## 2019-12-07 DIAGNOSIS — Z780 Asymptomatic menopausal state: Secondary | ICD-10-CM

## 2019-12-07 DIAGNOSIS — M8588 Other specified disorders of bone density and structure, other site: Secondary | ICD-10-CM

## 2019-12-07 DIAGNOSIS — M85852 Other specified disorders of bone density and structure, left thigh: Secondary | ICD-10-CM

## 2019-12-07 DIAGNOSIS — Z13820 Encounter for screening for osteoporosis: Secondary | ICD-10-CM

## 2020-04-16 ENCOUNTER — Other Ambulatory Visit: Payer: Self-pay | Admitting: Internal Medicine

## 2020-04-16 DIAGNOSIS — Z1231 Encounter for screening mammogram for malignant neoplasm of breast: Secondary | ICD-10-CM

## 2020-04-26 IMAGING — US US BREAST*L* LIMITED INC AXILLA
1 series · 6 of 6 positions shown · non-contrast
Comparison: Previous exam(s).
COMPARISON: Previous exam(s).

Addendum:
CLINICAL DATA: Patient was called back from screening mammogram for
a possible asymmetry in the left breast.

EXAM:
DIGITAL DIAGNOSTIC LEFT MAMMOGRAM WITH CAD AND TOMO
ULTRASOUND LEFT BREAST

[Series 1: us breast*left* limited inc axilla · 0.06mm/px · 6 of 6 slices shown]
[im 1/6]
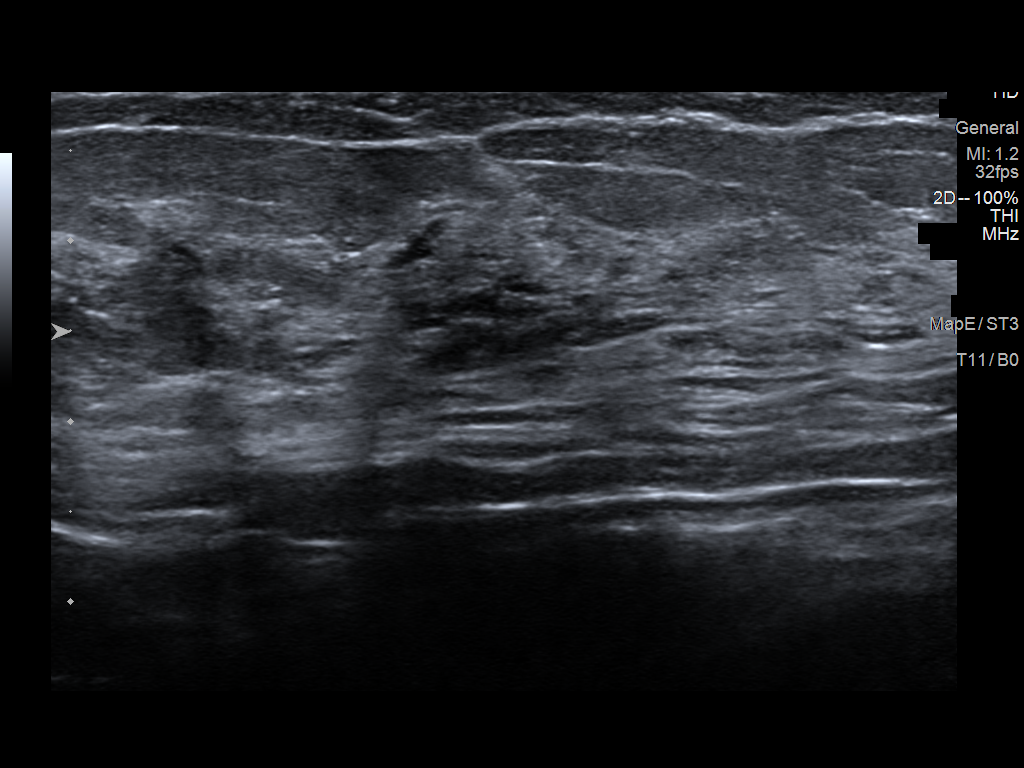
[im 2/6]
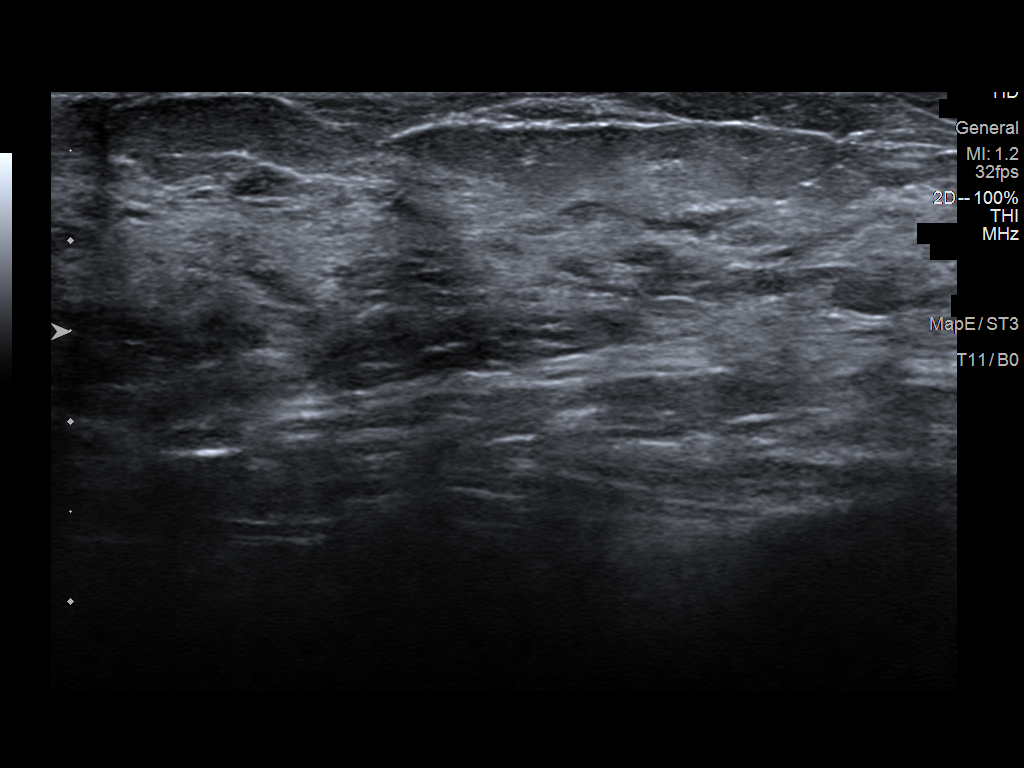
[im 3/6]
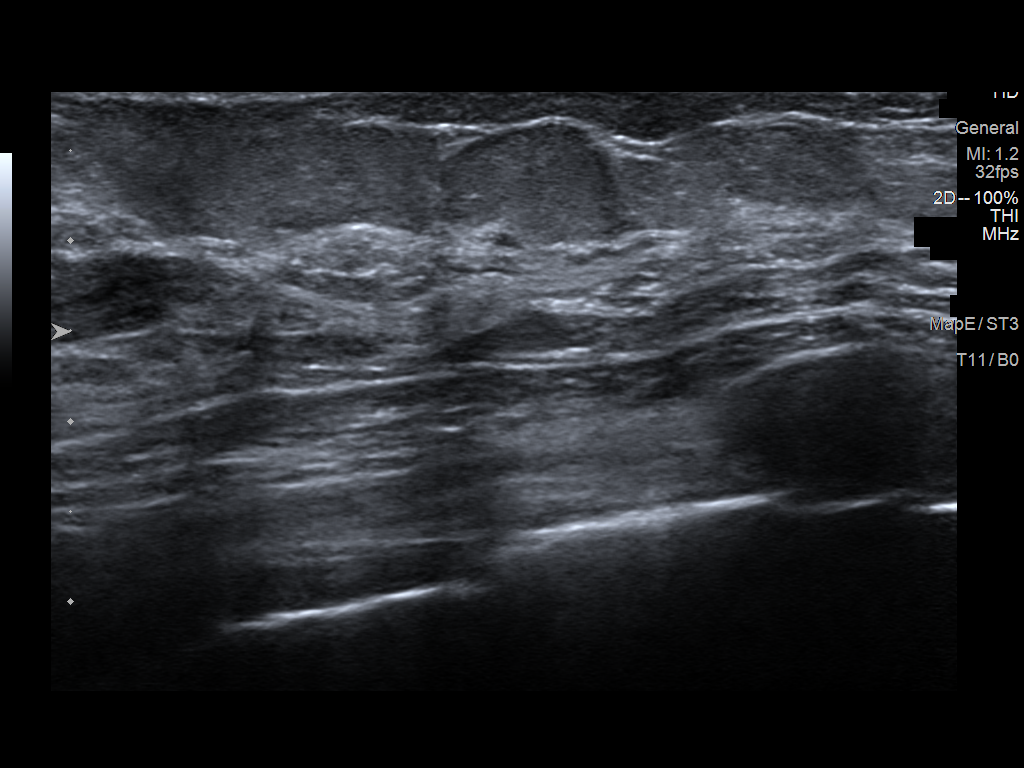
[im 4/6]
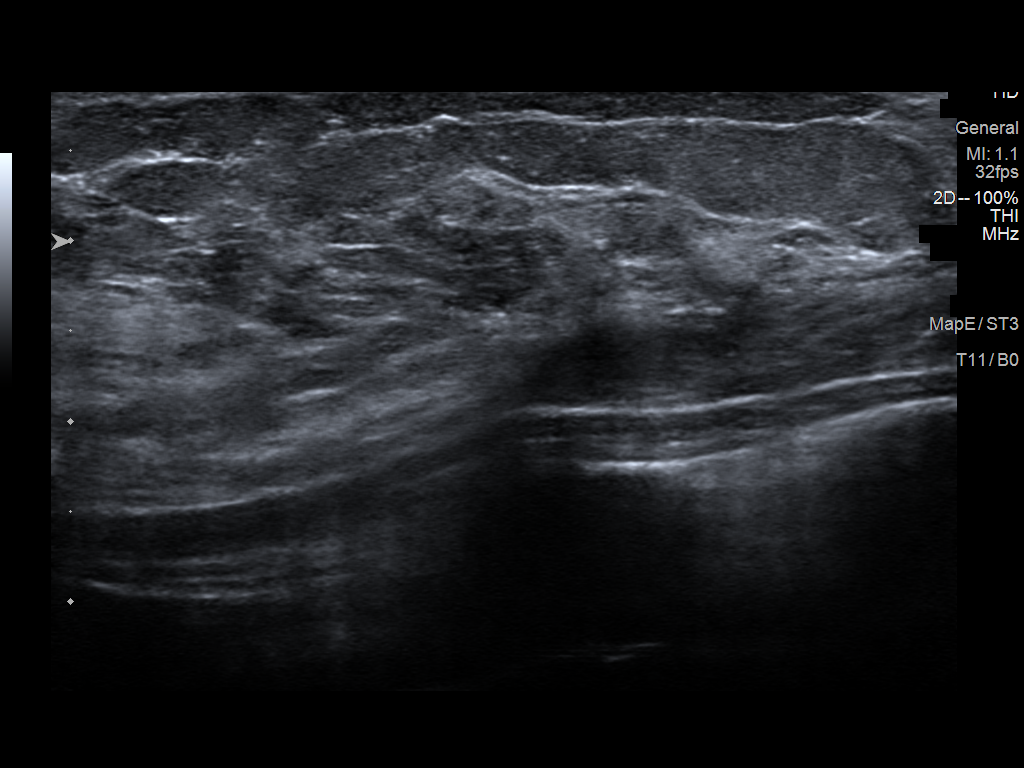
[im 5/6]
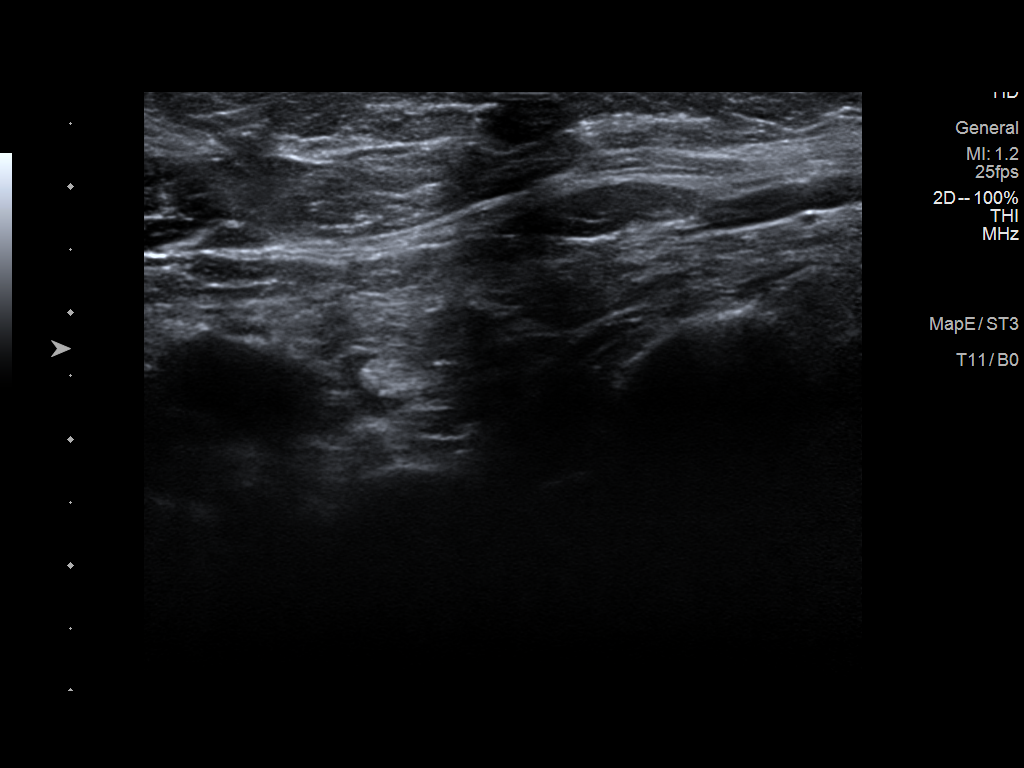
[im 6/6]
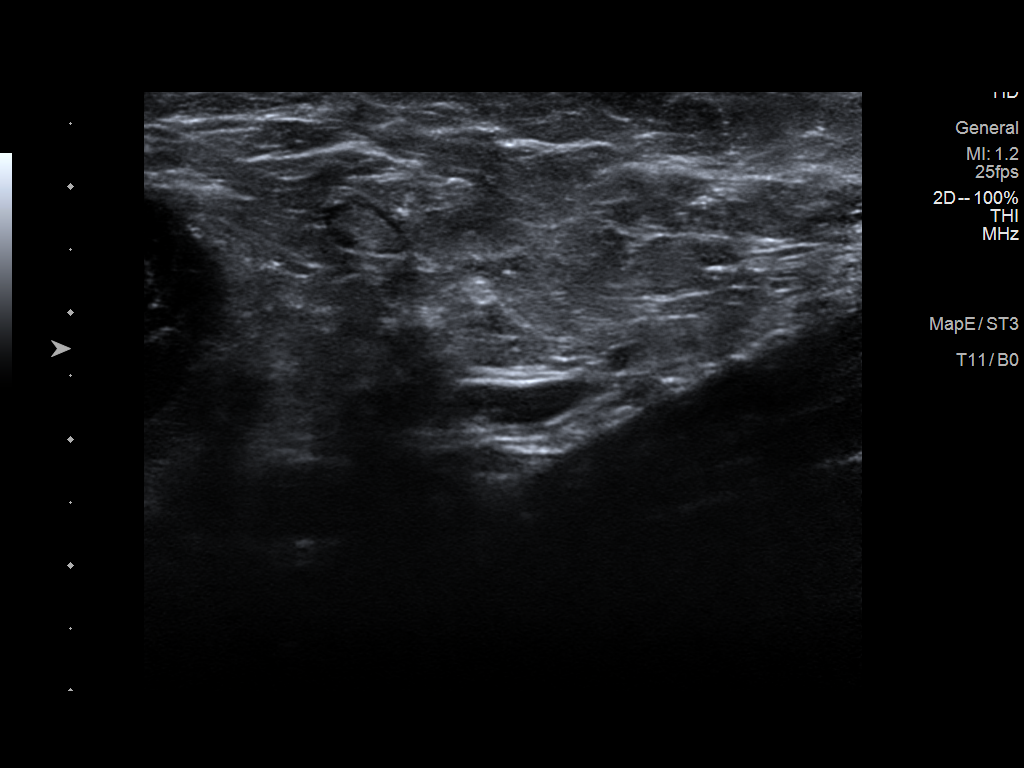

[6 of 6 positions shown; findings below may reference images not displayed]

ACR Breast Density Category c: The breast tissue is heterogeneously
dense, which may obscure small masses.
FINDINGS: Additional imaging of the left breast was performed. There is
persistence of a developing asymmetry in the far posterior
subareolar region of the left breast seen on the CC view. There are
no malignant type microcalcifications.

Mammographic images were processed with CAD.

On physical exam, I do not palpate a mass in the subareolar region
of the left breast.

Targeted ultrasound is performed, showing no suspicious mass,
abnormal shadowing or distortion in the subareolar region of the
breast. Sonographic evaluation the left axilla does not show any
enlarged adenopathy.
IMPRESSION: Suspicious developing asymmetry in the left breast.

RECOMMENDATION:
Stereotactic biopsy of the asymmetry in the left breast is
recommended. The biopsy will be scheduled the patient's convenience.

I have discussed the findings and recommendations with the patient.
If applicable, a reminder letter will be sent to the patient
regarding the next appointment.

BI-RADS CATEGORY  4: Suspicious.

ADDENDUM:
Patients screening mammogram dated 05/31/2019 stated there was a
possible asymmetry in the right breast but should have stated there
was a possible asymmetry in the left breast.

3D imaging of the right breast shows no suspicious mass, malignant
type microcalcifications or distortion.

*** End of Addendum ***
ACR Breast Density Category c: The breast tissue is heterogeneously
dense, which may obscure small masses.
FINDINGS: Additional imaging of the left breast was performed. There is
persistence of a developing asymmetry in the far posterior
subareolar region of the left breast seen on the CC view. There are
no malignant type microcalcifications.

Mammographic images were processed with CAD.

On physical exam, I do not palpate a mass in the subareolar region
of the left breast.

Targeted ultrasound is performed, showing no suspicious mass,
abnormal shadowing or distortion in the subareolar region of the
breast. Sonographic evaluation the left axilla does not show any
enlarged adenopathy.
IMPRESSION: Suspicious developing asymmetry in the left breast.

RECOMMENDATION:
Stereotactic biopsy of the asymmetry in the left breast is
recommended. The biopsy will be scheduled the patient's convenience.

I have discussed the findings and recommendations with the patient.
If applicable, a reminder letter will be sent to the patient
regarding the next appointment.

BI-RADS CATEGORY  4: Suspicious.

## 2020-05-31 ENCOUNTER — Ambulatory Visit: Payer: Medicare HMO

## 2020-07-09 ENCOUNTER — Ambulatory Visit
Admission: RE | Admit: 2020-07-09 | Discharge: 2020-07-09 | Disposition: A | Discharge status: Home / Self Care | Payer: Medicare Other | Source: Ambulatory Visit | Attending: Internal Medicine | Admitting: Internal Medicine

## 2020-07-09 ENCOUNTER — Other Ambulatory Visit: Payer: Self-pay

## 2020-07-09 DIAGNOSIS — Z1231 Encounter for screening mammogram for malignant neoplasm of breast: Secondary | ICD-10-CM

## 2020-08-14 DIAGNOSIS — E559 Vitamin D deficiency, unspecified: Secondary | ICD-10-CM | POA: Diagnosis not present

## 2020-08-14 DIAGNOSIS — E7800 Pure hypercholesterolemia, unspecified: Secondary | ICD-10-CM | POA: Diagnosis not present

## 2020-08-21 DIAGNOSIS — E7800 Pure hypercholesterolemia, unspecified: Secondary | ICD-10-CM | POA: Diagnosis not present

## 2020-08-21 DIAGNOSIS — D126 Benign neoplasm of colon, unspecified: Secondary | ICD-10-CM | POA: Diagnosis not present

## 2020-08-21 DIAGNOSIS — Z0000 Encounter for general adult medical examination without abnormal findings: Secondary | ICD-10-CM | POA: Diagnosis not present

## 2020-08-21 DIAGNOSIS — R85612 Low grade squamous intraepithelial lesion on cytologic smear of anus (LGSIL): Secondary | ICD-10-CM | POA: Diagnosis not present

## 2020-08-21 DIAGNOSIS — E559 Vitamin D deficiency, unspecified: Secondary | ICD-10-CM | POA: Diagnosis not present

## 2020-11-20 ENCOUNTER — Ambulatory Visit (INDEPENDENT_AMBULATORY_CARE_PROVIDER_SITE_OTHER): Payer: Medicare HMO | Admitting: Obstetrics & Gynecology

## 2020-11-20 ENCOUNTER — Encounter: Payer: Self-pay | Admitting: Obstetrics & Gynecology

## 2020-11-20 ENCOUNTER — Other Ambulatory Visit: Payer: Self-pay

## 2020-11-20 VITALS — BP 140/90 | Ht 65.0 in | Wt 136.0 lb

## 2020-11-20 DIAGNOSIS — Z780 Asymptomatic menopausal state: Secondary | ICD-10-CM

## 2020-11-20 DIAGNOSIS — M85852 Other specified disorders of bone density and structure, left thigh: Secondary | ICD-10-CM

## 2020-11-20 DIAGNOSIS — Z01419 Encounter for gynecological examination (general) (routine) without abnormal findings: Secondary | ICD-10-CM | POA: Diagnosis not present

## 2020-11-20 NOTE — Progress Notes (Signed)
Karina Wise 13-May-1956 [id number]   History:    65 y.o. G0 Married  QQ:[id number] presenting for annual gyn exam   XIP:[id number], well on no HRT. No PMB. No pelvic pain. Abstinent, husband with Prostate Cancer on Lupron. Urine/BMs normal. Breasts normal. BMI 22.63. Fit, walking and golfing. Health labs with Dr Osborne Casco. Will schedule Colono, last one 6 yrs ago.   Past medical history,surgical history, family history and social history were all reviewed and documented in the EPIC chart.  Gynecologic History No LMP recorded. Patient is postmenopausal.  Obstetric History OB History  Gravida Para Term Preterm AB Living  0 0 0 0 0 0  SAB IAB Ectopic Multiple Live Births  0 0 0 0 0     ROS: A ROS was performed and pertinent positives and negatives are included in the history.  GENERAL: No fevers or chills. HEENT: No change in vision, no earache, sore throat or sinus congestion. NECK: No pain or stiffness. CARDIOVASCULAR: No chest pain or pressure. No palpitations. PULMONARY: No shortness of breath, cough or wheeze. GASTROINTESTINAL: No abdominal pain, nausea, vomiting or diarrhea, melena or bright red blood per rectum. GENITOURINARY: No urinary frequency, urgency, hesitancy or dysuria. MUSCULOSKELETAL: No joint or muscle pain, no back pain, no recent trauma. DERMATOLOGIC: No rash, no itching, no lesions. ENDOCRINE: No polyuria, polydipsia, no heat or cold intolerance. No recent change in weight. HEMATOLOGICAL: No anemia or easy bruising or bleeding. NEUROLOGIC: No headache, seizures, numbness, tingling or weakness. PSYCHIATRIC: No depression, no loss of interest in normal activity or change in sleep pattern.     Exam:   BP 140/90   Ht 5\' 5"  (1.651 m)   Wt 136 lb (61.7 kg)   BMI 22.63 kg/m   Body mass index is 22.63 kg/m.  General appearance : Well developed well nourished female. No acute distress HEENT: Eyes: no retinal hemorrhage or  exudates,  Neck supple, trachea midline, no carotid bruits, no thyroidmegaly Lungs: Clear to auscultation, no rhonchi or wheezes, or rib retractions  Heart: Regular rate and rhythm, no murmurs or gallops Breast:Examined in sitting and supine position were symmetrical in appearance, no palpable masses or tenderness,  no skin retraction, no nipple inversion, no nipple discharge, no skin discoloration, no axillary or supraclavicular lymphadenopathy Abdomen: no palpable masses or tenderness, no rebound or guarding Extremities: no edema or skin discoloration or tenderness  Pelvic: Vulva: Normal             Vagina: No gross lesions or discharge  Cervix: No gross lesions or discharge  Uterus  AV, normal size, shape and consistency, non-tender and mobile  Adnexa  Without masses or tenderness  Anus: Normal   Assessment/Plan:  65 y.o. female for annual exam   1. Well female exam with routine gynecological exam Normal gynecologic exam in menopause.  Pap test April 2021 was negative, no indication to repeat this year.  Breast exam normal.  Screening mammogram December 2021 was negative.  Colonoscopy 6 years ago, will schedule a consult anoscopy this year.  Health labs with family physician.  Good body mass index at 22.63.  Continue with fitness and healthy nutrition.  2. Postmenopause Well on no hormone replacement therapy.  No postmenopausal bleeding.  3. Osteopenia of neck of left femur Bone density May 2021 showed very mild osteopenia localized at the left femoral neck with a T score of -1.3.  We will repeat a bone density at 3 years.  Continue with calcium total of 1.5  g/day, vitamin D supplements and weightbearing physical activities on a regular basis.  Princess Bruins MD, 8:24 AM 11/20/2020

## 2021-06-02 ENCOUNTER — Other Ambulatory Visit: Payer: Self-pay | Admitting: Internal Medicine

## 2021-06-02 DIAGNOSIS — Z1231 Encounter for screening mammogram for malignant neoplasm of breast: Secondary | ICD-10-CM

## 2021-07-10 ENCOUNTER — Ambulatory Visit
Admission: RE | Admit: 2021-07-10 | Discharge: 2021-07-10 | Disposition: A | Discharge status: Home / Self Care | Payer: Medicare HMO | Source: Ambulatory Visit | Attending: Internal Medicine | Admitting: Internal Medicine

## 2021-07-10 DIAGNOSIS — Z1231 Encounter for screening mammogram for malignant neoplasm of breast: Secondary | ICD-10-CM | POA: Diagnosis not present

## 2021-09-01 DIAGNOSIS — E7800 Pure hypercholesterolemia, unspecified: Secondary | ICD-10-CM | POA: Diagnosis not present

## 2021-09-01 DIAGNOSIS — E559 Vitamin D deficiency, unspecified: Secondary | ICD-10-CM | POA: Diagnosis not present

## 2021-09-01 DIAGNOSIS — R7989 Other specified abnormal findings of blood chemistry: Secondary | ICD-10-CM | POA: Diagnosis not present

## 2021-09-08 DIAGNOSIS — R82998 Other abnormal findings in urine: Secondary | ICD-10-CM | POA: Diagnosis not present

## 2021-09-08 DIAGNOSIS — E559 Vitamin D deficiency, unspecified: Secondary | ICD-10-CM | POA: Diagnosis not present

## 2021-09-08 DIAGNOSIS — E7800 Pure hypercholesterolemia, unspecified: Secondary | ICD-10-CM | POA: Diagnosis not present

## 2021-09-08 DIAGNOSIS — R85612 Low grade squamous intraepithelial lesion on cytologic smear of anus (LGSIL): Secondary | ICD-10-CM | POA: Diagnosis not present

## 2021-09-08 DIAGNOSIS — Z0000 Encounter for general adult medical examination without abnormal findings: Secondary | ICD-10-CM | POA: Diagnosis not present

## 2021-09-08 DIAGNOSIS — Z1212 Encounter for screening for malignant neoplasm of rectum: Secondary | ICD-10-CM | POA: Diagnosis not present

## 2021-09-08 DIAGNOSIS — D126 Benign neoplasm of colon, unspecified: Secondary | ICD-10-CM | POA: Diagnosis not present

## 2021-11-28 ENCOUNTER — Ambulatory Visit (INDEPENDENT_AMBULATORY_CARE_PROVIDER_SITE_OTHER): Payer: Medicare HMO | Admitting: Obstetrics & Gynecology

## 2021-11-28 ENCOUNTER — Encounter: Payer: Self-pay | Admitting: Obstetrics & Gynecology

## 2021-11-28 VITALS — BP 138/84 | HR 114 | Resp 16 | Ht 64.0 in | Wt 139.0 lb

## 2021-11-28 DIAGNOSIS — M85852 Other specified disorders of bone density and structure, left thigh: Secondary | ICD-10-CM

## 2021-11-28 DIAGNOSIS — Z780 Asymptomatic menopausal state: Secondary | ICD-10-CM

## 2021-11-28 DIAGNOSIS — Z01419 Encounter for gynecological examination (general) (routine) without abnormal findings: Secondary | ICD-10-CM | POA: Diagnosis not present

## 2021-11-28 NOTE — Progress Notes (Signed)
? ? ?Karina Wise 02-Aug-1955 [id number] ? ? ?History:    66 y.o. G0 Married ?  ?RP:  Established patient presenting for annual gyn exam  ?  ?HPI:  Post-menopause, well on no HRT.  No PMB.  No pelvic pain.  Abstinent, husband with Prostate Cancer on Lupron.  Pap Neg 10/2019.  No h/o abnormal Pap.  Will repeat Pap next year. Urine/BMs normal.  Breasts normal.  Mammo Neg 06/2021.  BMI 23.86.  Fit, walking and golfing.  BD 11/2019 Osteopenia T-Score at the Lt Femoral Neck -1.3.  Will repeat at 1 yrs.  Health labs with Dr Osborne Casco.  Will schedule Colono this year, last one 7 yrs ago.  ? ?Past medical history,surgical history, family history and social history were all reviewed and documented in the EPIC chart. ? ?Gynecologic History ?No LMP recorded. Patient is postmenopausal. ? ?Obstetric History ?OB History  ?Gravida Para Term Preterm AB Living  ?0 0 0 0 0 0  ?SAB IAB Ectopic Multiple Live Births  ?0 0 0 0 0  ? ? ? ?ROS: A ROS was performed and pertinent positives and negatives are included in the history. ? GENERAL: No fevers or chills. HEENT: No change in vision, no earache, sore throat or sinus congestion. NECK: No pain or stiffness. CARDIOVASCULAR: No chest pain or pressure. No palpitations. PULMONARY: No shortness of breath, cough or wheeze. GASTROINTESTINAL: No abdominal pain, nausea, vomiting or diarrhea, melena or bright red blood per rectum. GENITOURINARY: No urinary frequency, urgency, hesitancy or dysuria. MUSCULOSKELETAL: No joint or muscle pain, no back pain, no recent trauma. DERMATOLOGIC: No rash, no itching, no lesions. ENDOCRINE: No polyuria, polydipsia, no heat or cold intolerance. No recent change in weight. HEMATOLOGICAL: No anemia or easy bruising or bleeding. NEUROLOGIC: No headache, seizures, numbness, tingling or weakness. PSYCHIATRIC: No depression, no loss of interest in normal activity or change in sleep pattern.  ?  ? ?Exam: ? ? ?BP 138/84   Pulse (!) 114   Resp 16   Ht '5\' 4"'$  (1.626 m)    Wt 139 lb (63 kg)   BMI 23.86 kg/m?  ? ?Body mass index is 23.86 kg/m?. ? ?General appearance : Well developed well nourished female. No acute distress ?HEENT: Eyes: no retinal hemorrhage or exudates,  Neck supple, trachea midline, no carotid bruits, no thyroidmegaly ?Lungs: Clear to auscultation, no rhonchi or wheezes, or rib retractions  ?Heart: Regular rate and rhythm, no murmurs or gallops ?Breast:Examined in sitting and supine position were symmetrical in appearance, no palpable masses or tenderness,  no skin retraction, no nipple inversion, no nipple discharge, no skin discoloration, no axillary or supraclavicular lymphadenopathy ?Abdomen: no palpable masses or tenderness, no rebound or guarding ?Extremities: no edema or skin discoloration or tenderness ? ?Pelvic: Vulva: Normal ?            Vagina: No gross lesions or discharge ? Cervix: No gross lesions or discharge ? Uterus  AV, normal size, shape and consistency, non-tender and mobile ? Adnexa  Without masses or tenderness ? Anus: Normal ? ? ?Assessment/Plan:  66 y.o. female for annual exam  ? ?1. Well female exam with routine gynecological exam ?Post-menopause, well on no HRT.  No PMB.  No pelvic pain.  Abstinent, husband with Prostate Cancer on Lupron.  Pap Neg 10/2019.  No h/o abnormal Pap.  Will repeat Pap next year. Urine/BMs normal.  Breasts normal.  Mammo Neg 06/2021.  BMI 23.86.  Fit, walking and golfing.  BD 11/2019 Osteopenia T-Score  at the Lt Femoral Neck -1.3.  Will repeat at 3 yrs.  Health labs with Dr Osborne Casco.  Will schedule Colono this year, last one 7 yrs ago.  ? ?2. Postmenopause ?Post-menopause, well on no HRT.  No PMB.  No pelvic pain.  Abstinent. ? ?3. Osteopenia of neck of left femur  ?BMI 23.86.  Fit, walking and golfing.  BD 11/2019 Osteopenia T-Score at the Lt Femoral Neck -1.3.  Will repeat at 3 yrs. Continue with Vit D, Ca++ 1.5 g/d total. ? ?Princess Bruins MD, 8:43 AM 11/28/2021 ? ?  ?

## 2021-12-01 DIAGNOSIS — H43813 Vitreous degeneration, bilateral: Secondary | ICD-10-CM | POA: Diagnosis not present

## 2021-12-01 DIAGNOSIS — H25813 Combined forms of age-related cataract, bilateral: Secondary | ICD-10-CM | POA: Diagnosis not present

## 2021-12-01 DIAGNOSIS — H11153 Pinguecula, bilateral: Secondary | ICD-10-CM | POA: Diagnosis not present

## 2021-12-01 DIAGNOSIS — H04123 Dry eye syndrome of bilateral lacrimal glands: Secondary | ICD-10-CM | POA: Diagnosis not present

## 2022-05-07 ENCOUNTER — Other Ambulatory Visit: Payer: Self-pay | Admitting: Internal Medicine

## 2022-05-07 DIAGNOSIS — Z1231 Encounter for screening mammogram for malignant neoplasm of breast: Secondary | ICD-10-CM

## 2022-05-29 IMAGING — MG MM DIGITAL SCREENING BILAT W/ TOMO AND CAD
8 series · 9 of 24 positions shown · non-contrast
Comparison: Previous exam(s).

CLINICAL DATA: Screening.

EXAM:
DIGITAL SCREENING BILATERAL MAMMOGRAM WITH TOMOSYNTHESIS AND CAD
TECHNIQUE: Bilateral screening digital craniocaudal and mediolateral oblique
mammograms were obtained. Bilateral screening digital breast
tomosynthesis was performed. The images were evaluated with
computer-aided detection.

[L MLO synth-2D]
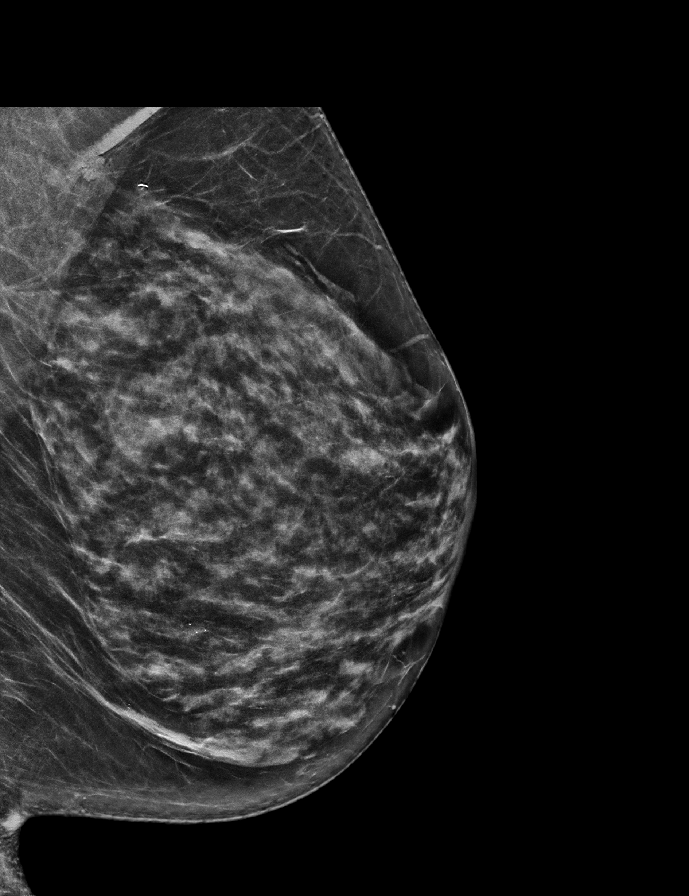

[R MLO synth-2D]
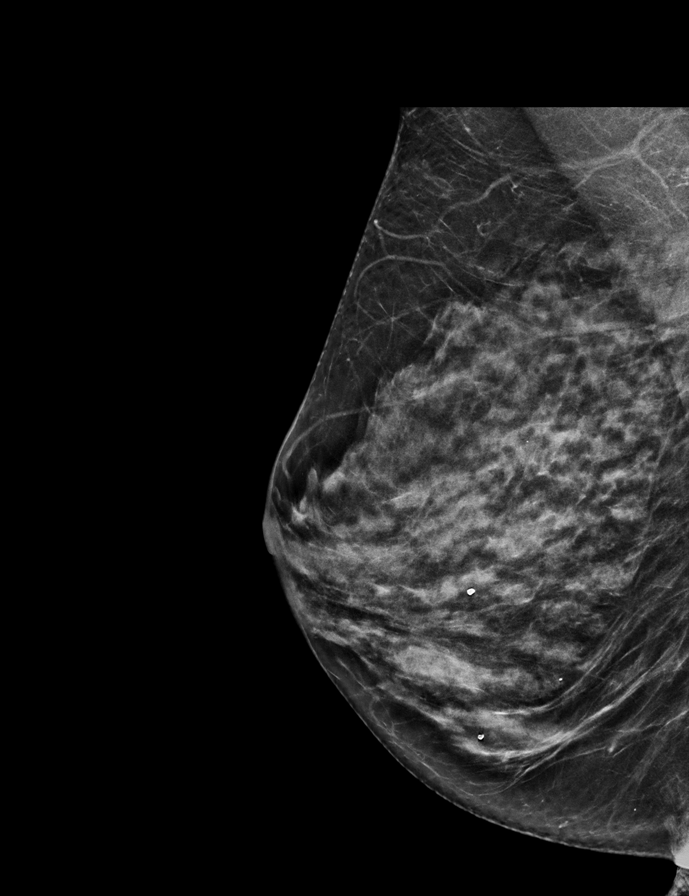

[L CC synth-2D]
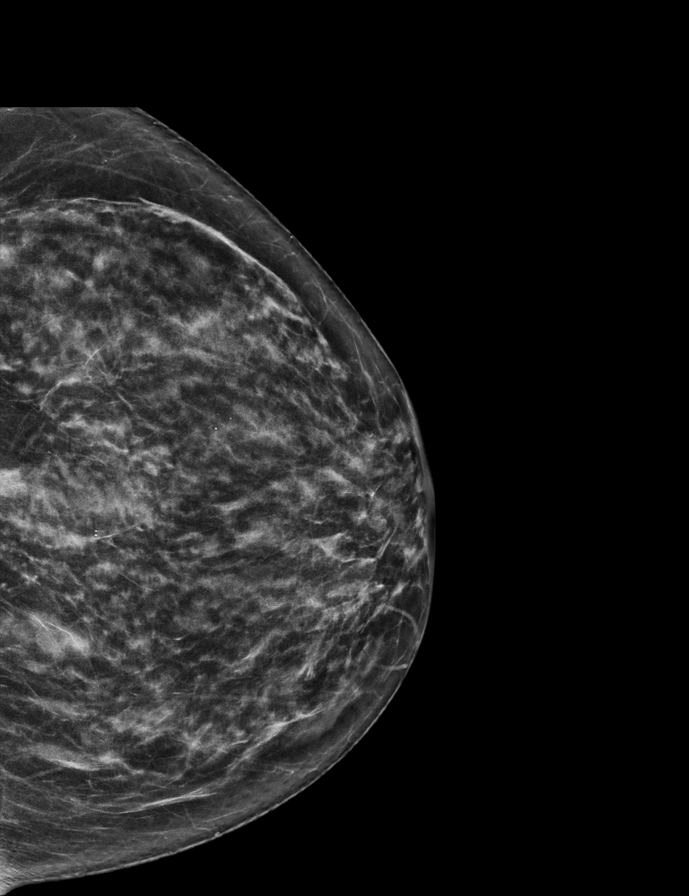

[R CC synth-2D]
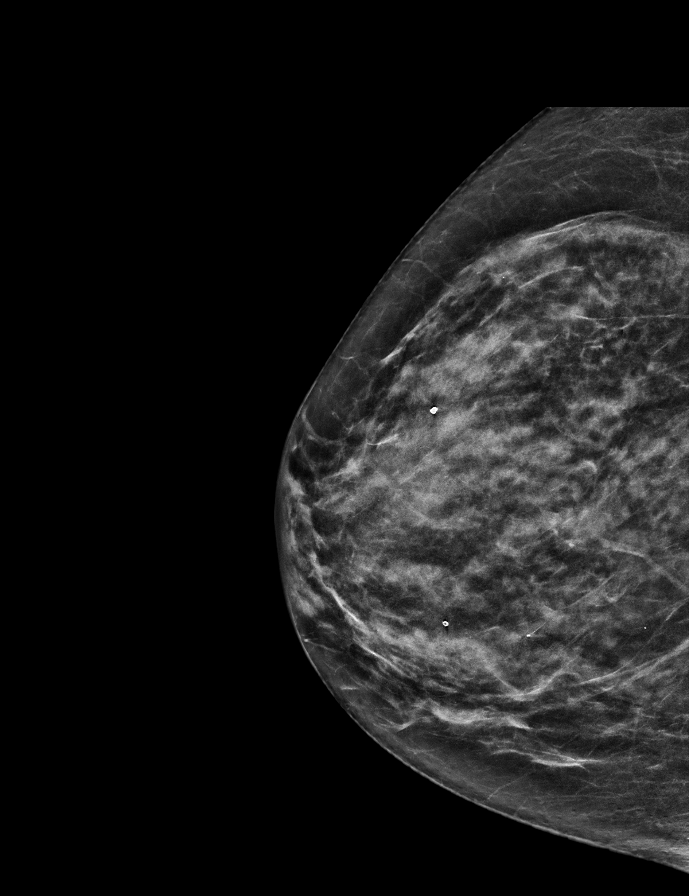

[R MLO tomo · 2 of 60 frames shown]
[frame 20/60]
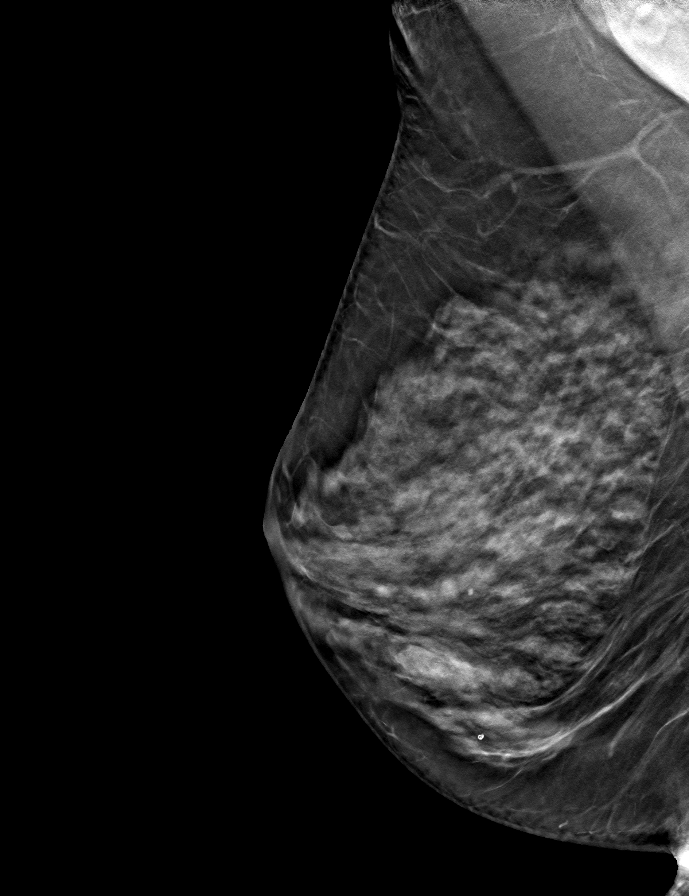
[frame 31/60]
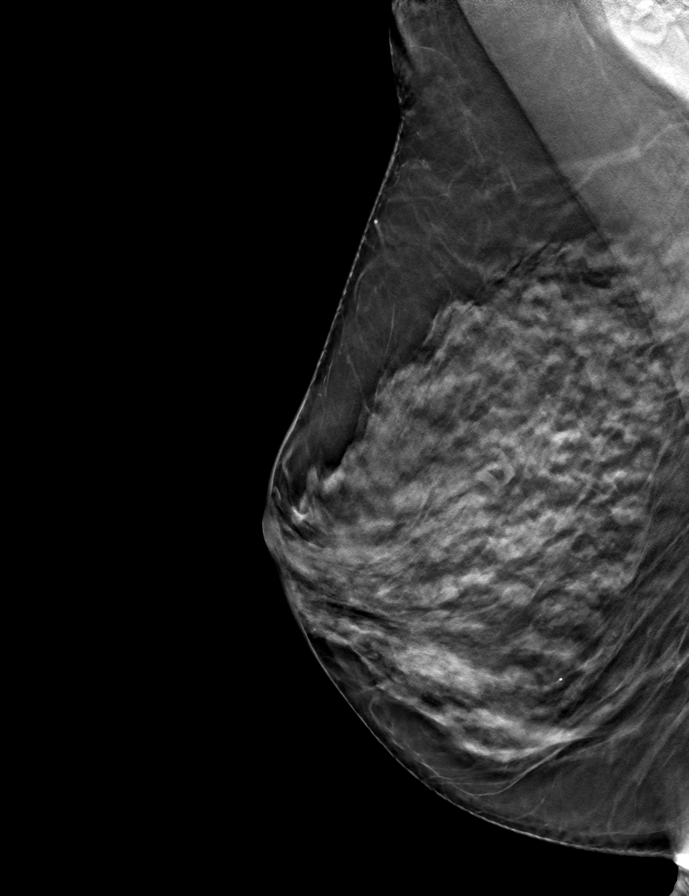

[L CC tomo · tomo slice 31/61.0]
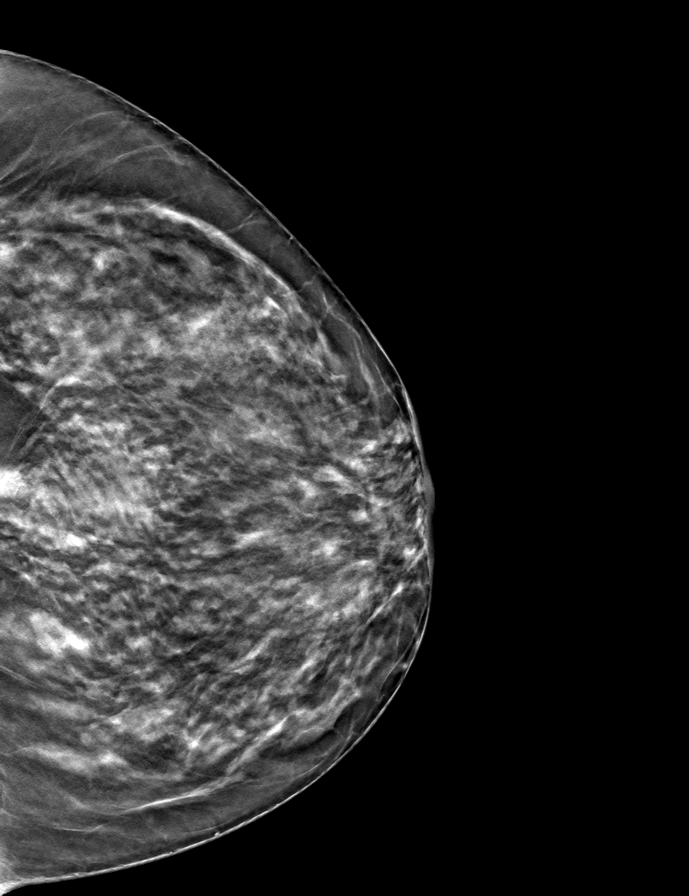

[L MLO tomo · tomo slice 33/64.0]
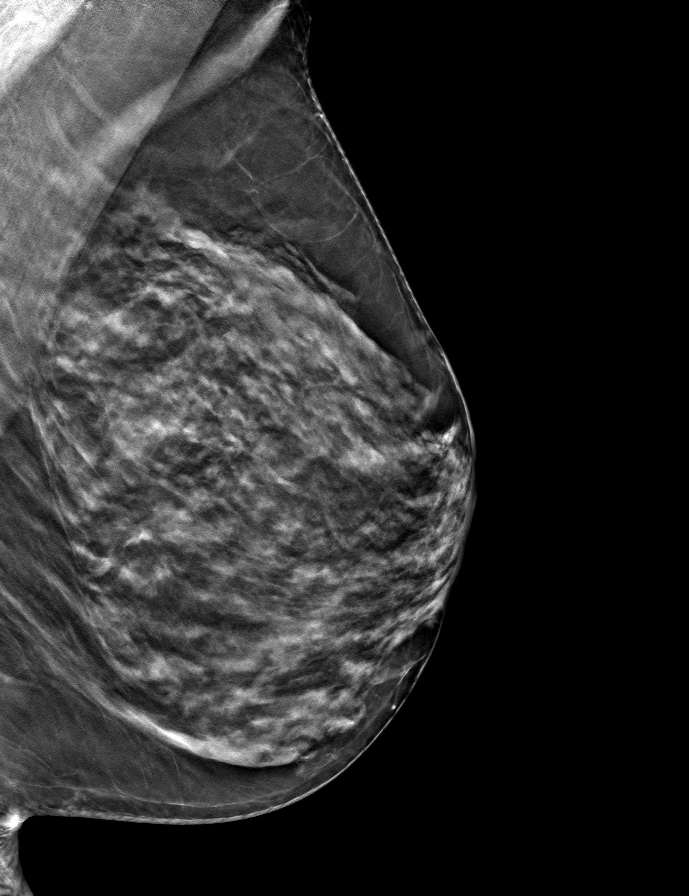

[R CC tomo · tomo slice 31/61.0]
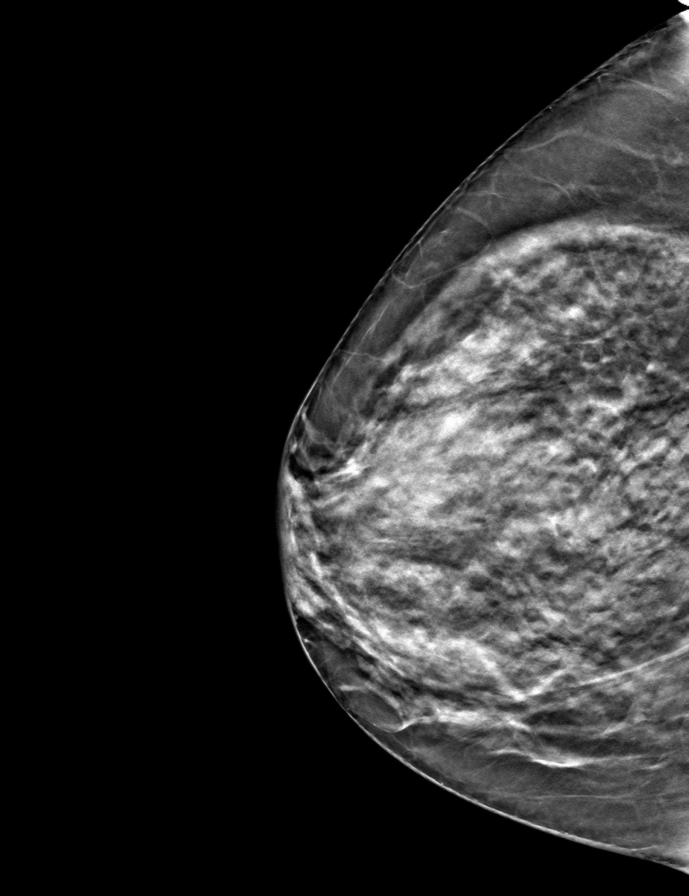

[9 of 24 positions shown; findings below may reference images not displayed]

ACR Breast Density Category c: The breast tissue is heterogeneously
dense, which may obscure small masses.
FINDINGS: There are no findings suspicious for malignancy.
IMPRESSION: No mammographic evidence of malignancy. A result letter of this
screening mammogram will be mailed directly to the patient.

RECOMMENDATION:
Screening mammogram in one year. (Code:[id number]-W-BC3)

BI-RADS CATEGORY  1: Negative.

## 2022-07-13 ENCOUNTER — Ambulatory Visit
Admission: RE | Admit: 2022-07-13 | Discharge: 2022-07-13 | Disposition: A | Discharge status: Home / Self Care | Payer: Medicare HMO | Source: Ambulatory Visit | Attending: Internal Medicine | Admitting: Internal Medicine

## 2022-07-13 DIAGNOSIS — Z1231 Encounter for screening mammogram for malignant neoplasm of breast: Secondary | ICD-10-CM

## 2022-07-15 ENCOUNTER — Other Ambulatory Visit: Payer: Self-pay | Admitting: Internal Medicine

## 2022-07-15 DIAGNOSIS — R928 Other abnormal and inconclusive findings on diagnostic imaging of breast: Secondary | ICD-10-CM

## 2022-07-24 ENCOUNTER — Ambulatory Visit
Admission: RE | Admit: 2022-07-24 | Discharge: 2022-07-24 | Disposition: A | Discharge status: Home / Self Care | Payer: Medicare HMO | Source: Ambulatory Visit | Attending: Internal Medicine | Admitting: Internal Medicine

## 2022-07-24 ENCOUNTER — Other Ambulatory Visit: Payer: Self-pay | Admitting: Internal Medicine

## 2022-07-24 DIAGNOSIS — N6320 Unspecified lump in the left breast, unspecified quadrant: Secondary | ICD-10-CM

## 2022-07-24 DIAGNOSIS — R928 Other abnormal and inconclusive findings on diagnostic imaging of breast: Secondary | ICD-10-CM

## 2022-07-24 DIAGNOSIS — N6322 Unspecified lump in the left breast, upper inner quadrant: Secondary | ICD-10-CM | POA: Diagnosis not present

## 2022-08-05 ENCOUNTER — Ambulatory Visit
Admission: RE | Admit: 2022-08-05 | Discharge: 2022-08-05 | Disposition: A | Discharge status: Home / Self Care | Payer: Medicare HMO | Source: Ambulatory Visit | Attending: Internal Medicine | Admitting: Internal Medicine

## 2022-08-05 DIAGNOSIS — N6320 Unspecified lump in the left breast, unspecified quadrant: Secondary | ICD-10-CM

## 2022-08-05 DIAGNOSIS — N6322 Unspecified lump in the left breast, upper inner quadrant: Secondary | ICD-10-CM | POA: Diagnosis not present

## 2022-08-05 DIAGNOSIS — C50919 Malignant neoplasm of unspecified site of unspecified female breast: Secondary | ICD-10-CM

## 2022-08-05 DIAGNOSIS — R928 Other abnormal and inconclusive findings on diagnostic imaging of breast: Secondary | ICD-10-CM

## 2022-08-05 DIAGNOSIS — D0582 Other specified type of carcinoma in situ of left breast: Secondary | ICD-10-CM | POA: Diagnosis not present

## 2022-08-05 HISTORY — DX: Malignant neoplasm of unspecified site of unspecified female breast: C50.919

## 2022-08-05 HISTORY — PX: BREAST BIOPSY: SHX20

## 2022-08-13 ENCOUNTER — Ambulatory Visit: Payer: Self-pay | Admitting: General Surgery

## 2022-08-13 DIAGNOSIS — C50212 Malignant neoplasm of upper-inner quadrant of left female breast: Secondary | ICD-10-CM | POA: Diagnosis not present

## 2022-08-13 DIAGNOSIS — Z170 Estrogen receptor positive status [ER+]: Secondary | ICD-10-CM | POA: Diagnosis not present

## 2022-08-13 MED ORDER — KETOROLAC TROMETHAMINE 15 MG/ML IJ SOLN: 15.0000 mg | Freq: Once | INTRAMUSCULAR | Status: AC

## 2022-08-18 ENCOUNTER — Other Ambulatory Visit: Payer: Self-pay | Admitting: General Surgery

## 2022-08-18 DIAGNOSIS — C50212 Malignant neoplasm of upper-inner quadrant of left female breast: Secondary | ICD-10-CM

## 2022-08-19 ENCOUNTER — Encounter: Payer: Self-pay | Admitting: *Deleted

## 2022-08-19 DIAGNOSIS — Z170 Estrogen receptor positive status [ER+]: Secondary | ICD-10-CM

## 2022-08-19 NOTE — Progress Notes (Signed)
New Breast Cancer Diagnosis: Left Breast UIQ  Did patient present with symptoms (if so, please note symptoms) or screening mammography?:Screening Mass    Location and Extent of disease :left breast. Located at 11 o'clock position, measured 1.6 cm in greatest dimension. Adenopathy no.  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 Marlou Starks 08/13/2022 -Malignant neoplasm of upper-inner quadrant of left breast in female, estrogen receptor positive  - Ambulatory Referral to Oncology-Medical - Ambulatory Referral to Physical Therapy - Ambulatory Referral to Radiation Oncology  -Left Breast Lumpectomy with radioactive seed and SLN biopsy 09/09/2022   Medical oncologist, treatment if any:   Dr. Lindi Adie 08/20/2022 1:00 pm  Family History of Breast/Ovarian/Prostate Cancer:   Lymphedema issues, if any:      Pain issues, if any:     SAFETY ISSUES: Prior radiation?  Pacemaker/ICD?  Possible current pregnancy? Postmenopausal Is the patient on methotrexate?   Current Complaints / other details:

## 2022-08-20 ENCOUNTER — Inpatient Hospital Stay: Payer: Medicare HMO | Attending: Hematology and Oncology | Admitting: Hematology and Oncology

## 2022-08-20 ENCOUNTER — Ambulatory Visit
Admission: RE | Admit: 2022-08-20 | Discharge: 2022-08-20 | Disposition: A | Discharge status: Home / Self Care | Payer: Medicare HMO | Source: Ambulatory Visit | Attending: Radiation Oncology | Admitting: Radiation Oncology

## 2022-08-20 ENCOUNTER — Inpatient Hospital Stay: Payer: Medicare HMO

## 2022-08-20 ENCOUNTER — Encounter: Payer: Self-pay | Admitting: Radiation Oncology

## 2022-08-20 ENCOUNTER — Other Ambulatory Visit: Payer: Self-pay

## 2022-08-20 VITALS — BP 160/101 | HR 120 | Temp 97.7°F | Resp 20 | Ht 65.0 in | Wt 141.2 lb

## 2022-08-20 VITALS — BP 144/94 | HR 119 | Temp 97.2°F | Resp 17 | Wt 141.2 lb

## 2022-08-20 DIAGNOSIS — A630 Anogenital (venereal) warts: Secondary | ICD-10-CM | POA: Diagnosis not present

## 2022-08-20 DIAGNOSIS — C50212 Malignant neoplasm of upper-inner quadrant of left female breast: Secondary | ICD-10-CM | POA: Insufficient documentation

## 2022-08-20 DIAGNOSIS — Z170 Estrogen receptor positive status [ER+]: Secondary | ICD-10-CM | POA: Diagnosis not present

## 2022-08-20 DIAGNOSIS — Z800 Family history of malignant neoplasm of digestive organs: Secondary | ICD-10-CM | POA: Diagnosis not present

## 2022-08-20 DIAGNOSIS — R69 Illness, unspecified: Secondary | ICD-10-CM | POA: Diagnosis not present

## 2022-08-20 DIAGNOSIS — Z801 Family history of malignant neoplasm of trachea, bronchus and lung: Secondary | ICD-10-CM | POA: Insufficient documentation

## 2022-08-20 DIAGNOSIS — F419 Anxiety disorder, unspecified: Secondary | ICD-10-CM | POA: Insufficient documentation

## 2022-08-20 DIAGNOSIS — Z79899 Other long term (current) drug therapy: Secondary | ICD-10-CM | POA: Diagnosis not present

## 2022-08-20 NOTE — Assessment & Plan Note (Signed)
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 After reviewing the pathology in detail, we proceeded to discuss the different treatment options between surgery, radiation, chemotherapy, antiestrogen therapies.  Recommendations: 1. Breast conserving surgery followed by 2. Oncotype DX testing to determine if chemotherapy would be of any benefit followed by 3. Adjuvant radiation therapy followed by 4. Adjuvant antiestrogen therapy  Oncotype counseling: I discussed Oncotype DX test. I explained to the patient that this is a 21 gene panel to evaluate patient tumors DNA to calculate recurrence score. This would help determine whether patient has high risk or low risk breast cancer. She understands that if her tumor was found to be high risk, she would benefit from systemic chemotherapy. If low risk, no need of chemotherapy.  Return to clinic after surgery to discuss final pathology report and then determine if Oncotype DX testing will need to be sent.

## 2022-08-20 NOTE — Progress Notes (Signed)
[facility] NOTE  Patient Care Team: Tisovec, Fransico Him, MD as PCP - General (Internal Medicine) Mauro Kaufmann, RN as Oncology Nurse Navigator Rockwell Germany, RN as Oncology Nurse Navigator Nicholas Lose, MD as Consulting Physician (Hematology and Oncology)  CHIEF COMPLAINTS/PURPOSE OF CONSULTATION:  Newly diagnosed breast cancer  HISTORY OF PRESENTING ILLNESS:  Karina Wise 67 y.o. female is here because of recent diagnosis of left breast cancer.  Patient had routine screening mammogram that detected a left breast mass which by ultrasound measured 1.6 cm.  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  She was seen by Dr. Marlou Starks who has plan her for lumpectomy surgery on 09/09/2022.  She was sent to [place] for discussion regarding treatment options after surgery.  She had met with radiation oncology earlier this morning.  I reviewed her records extensively and collaborated the history with the patient.  SUMMARY OF ONCOLOGIC HISTORY: Oncology History  Malignant neoplasm of upper-inner quadrant of left breast in female, estrogen receptor positive ([place])  08/05/2022 Initial Diagnosis   Screening mammogram detected left breast mass by ultrasound measured 1.6 cm, axilla negative, biopsy revealed grade 2 invasive papillary carcinoma ER 100%, PR 80%, Ki-67 5%, HER2 0 negative   08/20/2022 Cancer Staging   Staging form: Breast, AJCC 8th Edition - Clinical: Stage IA (cT1c, cN0, cM0, G2, ER+, PR+, HER2-) - Signed by Nicholas Lose, MD on 08/20/2022 Histologic grading system: 3 grade system      MEDICAL HISTORY:  Past Medical History:  Diagnosis Date   Breast cancer ([place]) 08/05/2022   HPV (human papilloma virus) anogenital infection     SURGICAL HISTORY: Past Surgical History:  Procedure Laterality Date   APPENDECTOMY  1965   BREAST BIOPSY Left 2020   benign; [place]   BREAST BIOPSY Left 08/05/2022   [place] LT BREAST  BX W LOC DEV 1ST LESION IMG BX [place] US GUIDE 08/05/2022 [facility] M[facility]MOGR[facility]    SOCIAL HISTORY: Social History   Socioeconomic History   Marital status: Married    Spouse name: Not on file   Number of children: Not on file   Years of education: Not on file   Highest education level: Not on file  Occupational History   Not on file  Tobacco Use   Smoking status: Never   Smokeless tobacco: Never  Vaping Use   Vaping Use: Never used  Substance and Sexual Activity   Alcohol use: Yes    Alcohol/week: 1.0 standard drink of alcohol    Types: 1 Glasses of wine per week    Comment: 1 glass of wine a day    Drug use: Never   Sexual activity: Not Currently    Birth control/protection: Post-menopausal    Comment: 1st intercourse -16, partners- 4  Other Topics Concern   Not on file  Social History Narrative   Not on file   Social Determinants of Health   Financial Resource Strain: Not on file  Food Insecurity: No Food Insecurity (08/20/2022)   Hunger Vital Sign    Worried About Running Out of Food in the Last Year: Never true    Ran Out of Food in the Last Year: Never true  Transportation Needs: No Transportation Needs (08/20/2022)   PRAPARE - Hydrologist (Medical): No    Lack of Transportation (Non-Medical): No  Physical Activity: Not on file  Stress: Not on file  Social Connections:  Not on file  Intimate Partner Violence: Not At Risk (08/20/2022)   Humiliation, Afraid, Rape, and Kick questionnaire    Fear of Current or Ex-Partner: No    Emotionally Abused: No    Physically Abused: No    Sexually Abused: No    FAMILY HISTORY: Family History  Problem Relation Age of Onset   Cancer Mother        lung and kidney and liver -    Diabetes Mother    Hypertension Mother    Breast cancer Mother     ALLERGIES:  has No Known Allergies.  MEDICATIONS:  Current Outpatient Medications  Medication Sig Dispense Refill   B Complex Vitamins (VITAMIN B  COMPLEX PO) Take by mouth.     cholecalciferol (VITAMIN D3) 25 MCG (1000 UT) tablet Take 1,000 Units by mouth daily.     simvastatin (ZOCOR) 20 MG tablet Take 20 mg by mouth daily.     No current facility-administered medications for this visit.    REVIEW OF SYSTEMS:   Constitutional: Denies fevers, chills or abnormal night sweats Breast:  Denies any palpable lumps or discharge All other systems were reviewed with the patient and are negative.  PHYSICAL EXAMINATION: ECOG PERFORMANCE STATUS: 0 - Asymptomatic  Vitals:   08/20/22 1241  BP: (!) 144/94  Pulse: (!) 119  Resp: 17  Temp: (!) 97.2 F (36.2 C)  SpO2: 99%   Filed Weights   08/20/22 1241  Weight: 141 lb 3.2 oz (64 kg)    GENERAL:alert, no distress and comfortable      RADIOGRAPHIC STUDIES: I have personally reviewed the radiological reports and agreed with the findings in the report.  ASSESSMENT AND PLAN:  Malignant neoplasm of upper-inner quadrant of left breast in female, estrogen receptor positive ([place]) 08/05/2022:Screening mammogram detected left breast mass by ultrasound measured 1.6 cm, axilla negative, biopsy revealed grade 2 invasive papillary carcinoma ER 100%, PR 80%, Ki-67 5%, HER2 0 negative  Pathology and radiology counseling:Discussed with the patient, the details of pathology including the type of breast cancer,the clinical staging, the significance of ER, PR and HER-2/neu receptors and the implications for treatment. After reviewing the pathology in detail, we proceeded to discuss the different treatment options between surgery, radiation, chemotherapy, antiestrogen therapies.  Recommendations: 1. Breast conserving surgery followed by 2. Oncotype DX testing to determine if chemotherapy would be of any benefit followed by 3. Adjuvant radiation therapy followed by 4. Adjuvant antiestrogen therapy  Oncotype counseling: I discussed Oncotype DX test. I explained to the patient that this is a 21 gene  panel to evaluate patient tumors DNA to calculate recurrence score. This would help determine whether patient has high risk or low risk breast cancer. She understands that if her tumor was found to be high risk, she would benefit from systemic chemotherapy. If low risk, no need of chemotherapy.  Return to clinic after surgery to discuss final pathology report and then determine if Oncotype DX testing will need to be sent.   All questions were answered. The patient knows to call the clinic with any problems, questions or concerns.    Harriette Ohara, MD 08/20/22

## 2022-08-20 NOTE — Progress Notes (Signed)
Radiation Oncology         [phone] ________________________________  Name: Karina Wise        MRN: [id number]  Date of Service: 08/20/2022 DOB: 03/09/1956  NG:[id number], Fransico Him, MD  Jovita Kussmaul, MD     REFERRING PHYSICIAN: [name], MD   DIAGNOSIS: The encounter diagnosis was Malignant neoplasm of upper-inner quadrant of left breast in female, estrogen receptor positive ([place]).   HISTORY OF PRESENT ILLNESS: Karina Wise is a 67 y.o. female seen for a new diagnosis of left breast cancer. The patient was noted to have a screening detected left breast mass by mammography.  Further diagnostic workup in the 11:00 axis showed a 1.6 cm mass in her axilla on the left was negative for adenopathy.  She underwent a biopsy that showed grade 2 invasive papillary carcinoma that was ER/PR positive, HER2 negative with a Ki-67 of 5%.  She is planning to proceed with a lumpectomy on 09/09/2022.  She is seen to discuss adjuvant treatment options.    PREVIOUS RADIATION THERAPY: No   PAST MEDICAL HISTORY:  Past Medical History:  Diagnosis Date   HPV (human papilloma virus) anogenital infection        PAST SURGICAL HISTORY: Past Surgical History:  Procedure Laterality Date   APPENDECTOMY  1965   BREAST BIOPSY Left 2020   benign; [place]   BREAST BIOPSY Left 08/05/2022   [place] LT BREAST BX W LOC DEV 1ST LESION IMG BX [place] US GUIDE 08/05/2022 [facility]     FAMILY HISTORY:  Family History  Problem Relation Age of Onset   Cancer Mother        lung and kidney and liver -    Diabetes Mother    Hypertension Mother    Breast cancer Mother      SOCIAL HISTORY:  reports that she has never smoked. She has never used smokeless tobacco. She reports current alcohol use of about 1.0 standard drink of alcohol per week. She reports that she does not use drugs.  The patient is married and resides in [place].  She is retired from Pharmacist, hospital.    ALLERGIES: Patient has  no known allergies.   MEDICATIONS:  Current Outpatient Medications  Medication Sig Dispense Refill   B Complex Vitamins (VITAMIN B COMPLEX PO) Take by mouth.     cholecalciferol (VITAMIN D3) 25 MCG (1000 UT) tablet Take 1,000 Units by mouth daily.     simvastatin (ZOCOR) 20 MG tablet Take 20 mg by mouth daily.     No current facility-administered medications for this visit.     REVIEW OF SYSTEMS: On review of systems, the patient reports that she is doing okay but is very anxious about her diagnosis. She's had some firmness in her breast since her biopsy. No other complaints are otherwise verbalized.      PHYSICAL EXAM:  Wt Readings from Last 3 Encounters:  11/28/21 139 lb (63 kg)  11/20/20 136 lb (61.7 kg)  11/16/19 134 lb (60.8 kg)   Temp Readings from Last 3 Encounters:  No data found for Temp   BP Readings from Last 3 Encounters:  11/28/21 138/84  11/20/20 140/90  11/16/19 (!) 150/94   Pulse Readings from Last 3 Encounters:  11/28/21 (!) 114    In general this is a well appearing Caucasian female in no acute distress. She's alert and oriented x4 and appropriate throughout the examination. Cardiopulmonary assessment is negative for acute distress and she exhibits normal  effort. Bilateral breast exam is deferred.    ECOG = 1  0 - Asymptomatic (Fully active, able to carry on all predisease activities without restriction)  1 - Symptomatic but completely ambulatory (Restricted in physically strenuous activity but ambulatory and able to carry out work of a light or sedentary nature. For example, light housework, office work)  2 - Symptomatic, <50% in bed during the day (Ambulatory and capable of all self care but unable to carry out any work activities. Up and about more than 50% of waking hours)  3 - Symptomatic, >50% in bed, but not bedbound (Capable of only limited self-care, confined to bed or chair 50% or more of waking hours)  4 - Bedbound (Completely disabled.  Cannot carry on any self-care. Totally confined to bed or chair)  5 - Death   Eustace Pen MM, Creech RH, Tormey DC, et al. [phone]). "Toxicity and response criteria of the [facility]". [place] Oncol. 5 (6): 649-55    LABORATORY DATA:  No results found for: "WBC", "HGB", "HCT", "MCV", "PLT" No results found for: "NA", "K", "CL", "CO2" No results found for: "ALT", "AST", "GGT", "ALKPHOS", "BILITOT"    RADIOGRAPHY: [place] LT BREAST BX W LOC DEV 1ST LESION IMG BX [place] US GUIDE  Addendum Date: 08/07/2022   ADDENDUM REPORT: 08/07/2022 14:57 ADDENDUM: Pathology revealed GRADE 2 INVASIVE PAPILLARY CARCINOMA of the LEFT breast, 11 o'clock, 10 cmfn, (ribbon clip). This was found to be concordant by Dr. Nolon Nations. Pathology results were discussed with the patient by telephone. The patient reported doing well after the biopsy with tenderness at the site. Post biopsy instructions and care were reviewed and questions were answered. The patient was encouraged to call [facility] for any additional concerns. Per patient request, surgical consultation has been arranged with Dr. Autumn Messing at [facility] on August 13, 2022. Pathology results reported by Stacie Acres RN on 08/07/2022. Electronically Signed   By: Nolon Nations M.D.   On: 08/07/2022 14:57   Result Date: 08/07/2022 CLINICAL DATA:  Patient presents for ultrasound-guided core biopsy of mass in the LEFT breast. EXAM: ULTRASOUND GUIDED LEFT BREAST CORE NEEDLE BIOPSY COMPARISON:  Previous exam(s). PROCEDURE: I met with the patient and we discussed the procedure of ultrasound-guided biopsy, including benefits and alternatives. We discussed the high likelihood of a successful procedure. We discussed the risks of the procedure, including infection, bleeding, tissue injury, clip migration, and inadequate sampling. Informed written consent was given. The usual time-out protocol was performed  immediately prior to the procedure. Lesion quadrant: UPPER INNER QUADRANT LEFT Using sterile technique and 1% Lidocaine as local anesthetic, under direct ultrasound visualization, a 12 gauge spring-loaded device was used to perform biopsy of mass in the 11 o'clock location of the LEFT breast using a LATERAL approach. At the conclusion of the procedure ribbon shaped tissue marker clip was deployed into the biopsy cavity. Follow up 2 view mammogram was performed and dictated separately. IMPRESSION: Ultrasound guided biopsy of LEFT breast mass. No apparent complications. Electronically Signed: By: Nolon Nations M.D. On: 08/05/2022 08:10  MM CLIP PLACEMENT LEFT  Result Date: 08/05/2022 CLINICAL DATA:  Status post ultrasound-guided core biopsy of LEFT breast mass. EXAM: 3D DIAGNOSTIC LEFT MAMMOGRAM POST ULTRASOUND BIOPSY COMPARISON:  Previous exam(s). FINDINGS: 3D Mammographic images were obtained following ultrasound guided biopsy of mass in the 11 o'clock location of the LEFT breast and placement of a ribbon shaped clip. The biopsy marking clip is  in expected position at the site of biopsy. The clip is 1 centimeter inferior to the site of prior biopsy, marked with an X clip. IMPRESSION: Appropriate positioning of the ribbon shaped biopsy marking clip at the site of biopsy in the UPPER INNER QUADRANT LEFT breast. Final Assessment: Post Procedure Mammograms for Marker Placement Electronically Signed   By: Nolon Nations M.D.   On: 08/05/2022 08:30  US BREAST LTD UNI LEFT INC AXILLA  Result Date: 07/24/2022 CLINICAL DATA:  Patient returns after screening study for evaluation of possible LEFT breast mass. History of stereotactic biopsy of the LEFT breast performed 06/14/2019, demonstrating benign fibrocystic changes. EXAM: ULTRASOUND OF THE LEFT BREAST COMPARISON:  07/13/2022 and earlier FINDINGS: On physical exam, I palpate soft thickening without discrete mass in the UPPER portion of the LEFT breast.  Targeted ultrasound is performed, showing an oval hypoechoic parallel mass with angular and indistinct margins the 11 o'clock location of the LEFT breast 10 centimeters from the nipple. Mass measures 1.6 x 1.0 x 0.7 centimeters. Internal blood flow is documented on Doppler evaluation. Evaluation of the LEFT axilla is negative for adenopathy. IMPRESSION: Indeterminate LEFT breast mass warranting tissue diagnosis. No LEFT axillary adenopathy. RECOMMENDATION: Ultrasound-guided core biopsy of the LEFT breast. I have discussed the findings and recommendations with the patient. If applicable, a reminder letter will be sent to the patient regarding the next appointment. BI-RADS CATEGORY  4: Suspicious. Electronically Signed   By: Nolon Nations M.D.   On: 07/24/2022 16:34      IMPRESSION/PLAN: 1. Stage IA, cT1cN0M0, grade 2, ER/PR positive invasive papillary carcinoma of the left breast.Dr. Lisbeth Renshaw discusses the pathology findings and reviews the nature of early-stage left breast disease. The consensus from the breast conference includes breast conservation with lumpectomy.  Dr. Lindi Adie anticipates Oncotype Dx score to determine a role for systemic therapy. Provided that chemotherapy is not indicated, the patient's course would then be followed by external radiotherapy to the breast  to reduce risks of local recurrence followed by antiestrogen therapy. We discussed the risks, benefits, short, and long term effects of radiotherapy, as well as the curative intent, and the patient is interested in proceeding. Dr. Lisbeth Renshaw discusses the delivery and logistics of radiotherapy and anticipates a course of 4 weeks of radiotherapy to the left breast with deep inspiration breath-hold technique. We will see her back a few weeks after surgery to discuss the simulation process and anticipate we starting radiotherapy about 4-6 weeks after surgery.  2. Possible genetic predisposition to malignancy. The patient is a candidate for genetic  testing given her personal and family history. She will meet with our genetics clinic. 3. Anxiety regarding medical diagnosis. I offered referral to social work and spiritual navigation and she is interested in thinking about this before an appointment. She will let us or her other medical team know if she desires consultation.   In a visit lasting 60 minutes, greater than 50% of the time was spent face to face reviewing her case, as well as in preparation of, discussing, and coordinating the patient's care.  The above documentation reflects my direct findings during this shared patient visit. Please see the separate note by Dr. Lisbeth Renshaw on this date for the remainder of the patient's plan of care.    Carola Rhine, [facility]    **Disclaimer: This note was dictated with voice recognition software. Similar sounding words can inadvertently be transcribed and this note may contain transcription errors which may not have been corrected upon  publication of note.**

## 2022-08-25 ENCOUNTER — Other Ambulatory Visit: Payer: Self-pay

## 2022-08-25 ENCOUNTER — Ambulatory Visit: Payer: Medicare HMO | Attending: General Surgery | Admitting: Rehabilitation

## 2022-08-25 ENCOUNTER — Telehealth: Payer: Self-pay | Admitting: *Deleted

## 2022-08-25 ENCOUNTER — Encounter: Payer: Self-pay | Admitting: Rehabilitation

## 2022-08-25 DIAGNOSIS — C50212 Malignant neoplasm of upper-inner quadrant of left female breast: Secondary | ICD-10-CM | POA: Diagnosis not present

## 2022-08-25 DIAGNOSIS — Z170 Estrogen receptor positive status [ER+]: Secondary | ICD-10-CM | POA: Diagnosis not present

## 2022-08-25 DIAGNOSIS — R293 Abnormal posture: Secondary | ICD-10-CM | POA: Insufficient documentation

## 2022-08-25 NOTE — Therapy (Signed)
OUTPATIENT PHYSICAL THERAPY BREAST CANCER BASELINE EVALUATION   Patient Name: Karina Wise MRN: [id number] DOB:May 09, 1956, 67 y.o., female Today's Date: 08/25/2022  END OF SESSION:  PT End of Session - 08/25/22 1051     Visit Number 1    Number of Visits 2    Date for PT Re-Evaluation 09/28/22    Authorization Type No auth    PT Start Time 1050    PT Stop Time 1130    PT Time Calculation (min) 40 min    Activity Tolerance Patient tolerated treatment well    Behavior During Therapy [facility] for tasks assessed/performed             Past Medical History:  Diagnosis Date   Breast cancer ([place]) 08/05/2022   HPV (human papilloma virus) anogenital infection    Past Surgical History:  Procedure Laterality Date   APPENDECTOMY  1965   BREAST BIOPSY Left 2020   benign; [facility]   BREAST BIOPSY Left 08/05/2022   [place] LT BREAST BX W LOC DEV 1ST LESION IMG BX [place] US GUIDE 08/05/2022 [facility] MAMMOGRAPHY   Patient Active Problem List   Diagnosis Date Noted   Malignant neoplasm of upper-inner quadrant of left breast in female, estrogen receptor positive ([place]) 08/19/2022    PCP: Haywood Pao, MD  REFERRING PROVIDER: Jovita Kussmaul, MD  REFERRING DIAG:  Diagnosis  C50.212,Z17.0 (ICD-10-CM) - Malignant neoplasm of upper-inner quadrant of left breast in female, estrogen receptor positive ([place])   THERAPY DIAG:  Malignant neoplasm of upper-inner quadrant of left breast in female, estrogen receptor positive ([place])  Abnormal posture  Rationale for Evaluation and Treatment: Rehabilitation  ONSET DATE: 08/05/22  SUBJECTIVE:                                                                                                                                                                                           SUBJECTIVE STATEMENT: Patient reports she is here today to be seen by her medical team for her newly diagnosed left breast cancer.   PERTINENT HISTORY:  Pt will be having a Left  lumpectomy and SLNB on 09/09/22. Patient was diagnosed on 08/05/22 with left grade 2 invasive papillary carcinoma. It measures 1.6 cm and is located in the upper inner quadrant. It is ER/PR positive with a Ki67 of 5%.    PATIENT GOALS:   reduce lymphedema risk and learn post op HEP.   PAIN:  Are you having pain? No  PRECAUTIONS: Active CA   HAND DOMINANCE: right  WEIGHT BEARING RESTRICTIONS: No  FALLS:  Has patient fallen in last 6 months? No  LIVING ENVIRONMENT: Patient  lives with: husband  OCCUPATION: Retired  LEISURE: Golf, walks 1x per week.    PRIOR LEVEL OF FUNCTION: Independent   OBJECTIVE: COGNITION: Overall cognitive status: Within functional limits for tasks assessed    POSTURE:  Forward head and rounded shoulders posture  UPPER EXTREMITY AROM/PROM:  A/PROM RIGHT   eval   Shoulder extension   Shoulder flexion   Shoulder abduction   Shoulder internal rotation   Shoulder external rotation     (Blank rows = not tested)  A/PROM LEFT   eval  Shoulder extension 70  Shoulder flexion 150  Shoulder abduction 150  Shoulder internal rotation   Shoulder external rotation 90    (Blank rows = not tested)  CERVICAL AROM: All within normal limits:   UPPER EXTREMITY STRENGTH: 4+/5   LYMPHEDEMA ASSESSMENTS:   LANDMARK RIGHT   eval  10 cm proximal to olecranon process 26  Olecranon process 23.4  10 cm proximal to ulnar styloid process 20.2  Just proximal to ulnar styloid process 16.1  Across hand at thumb web space 19.3  At base of 2nd digit 6.6  (Blank rows = not tested)  LANDMARK LEFT   eval  10 cm proximal to olecranon process 26  Olecranon process 23.5  10 cm proximal to ulnar styloid process 20.2  Just proximal to ulnar styloid process 16.1  Across hand at thumb web space 19.4  At base of 2nd digit 6.6  (Blank rows = not tested)  L-DEX LYMPHEDEMA SCREENING: The patient was assessed using the L-Dex machine today to produce a lymphedema index  baseline score. The patient will be reassessed on a regular basis (typically every 3 months) to obtain new L-Dex scores. If the score is > 6.5 points away from his/her baseline score indicating onset of subclinical lymphedema, it will be recommended to wear a compression garment for 4 weeks, 12 hours per day and then be reassessed. If the score continues to be > 6.5 points from baseline at reassessment, we will initiate lymphedema treatment. Assessing in this manner has a 95% rate of preventing clinically significant lymphedema.  QUICK DASH SURVEY: 0%  PATIENT EDUCATION:  Education details: Lymphedema risk reduction and post op shoulder/posture HEP Person educated: Patient Education method: Explanation, Demonstration, Handout Education comprehension: Patient verbalized understanding and returned demonstration  HOME EXERCISE PROGRAM: Patient was instructed today in a home exercise program today for post op shoulder range of motion. These included active assist shoulder flexion in sitting, scapular retraction, wall walking with shoulder abduction, and hands behind head external rotation.  She was encouraged to do these twice a day, holding 3 seconds and repeating 5 times when permitted by her physician.   ASSESSMENT:  CLINICAL IMPRESSION: Pt will benefit from a post op PT reassessment to determine needs and from L-Dex screens every 3 months for 2 years to detect subclinical lymphedema.  Pt will benefit from skilled therapeutic intervention to improve on the following deficits: Decreased knowledge of precautions, impaired UE functional use, pain, decreased ROM, postural dysfunction.   PT treatment/interventions: ADL/self-care home management, pt/family education, therapeutic exercise  REHAB POTENTIAL: Excellent  CLINICAL DECISION MAKING: Stable/uncomplicated  EVALUATION COMPLEXITY: Low   GOALS: Goals reviewed with patient? YES  LONG TERM GOALS: (STG=LTG)    Name Target Date Goal  status  1 Pt will be able to verbalize understanding of pertinent lymphedema risk reduction practices relevant to her dx specifically related to skin care.  Baseline:  No knowledge 08/25/2022 Achieved at eval  2 Pt will  be able to return demo and/or verbalize understanding of the post op HEP related to regaining shoulder ROM. Baseline:  No knowledge 08/25/2022 Achieved at eval  3 Pt will be able to verbalize understanding of the importance of attending the post op After Breast CA Class for further lymphedema risk reduction education and therapeutic exercise.  Baseline:  No knowledge 08/25/2022 Achieved at eval  4 Pt will demo she has regained full shoulder ROM and function post operatively compared to baselines.  Baseline: See objective measurements taken today. 09/28/22     PLAN:  PT FREQUENCY/DURATION: EVAL and 1 follow up appointment.   PLAN FOR NEXT SESSION: will reassess 3-4 weeks post op to determine needs.   Patient will follow up at outpatient cancer rehab 3-4 weeks following surgery.  If the patient requires physical therapy at that time, a specific plan will be dictated and sent to the referring physician for approval. The patient was educated today on appropriate basic range of motion exercises to begin post operatively and the importance of attending the After Breast Cancer class following surgery.  Patient was educated today on lymphedema risk reduction practices as it pertains to recommendations that will benefit the patient immediately following surgery.  She verbalized good understanding.    Physical Therapy Information for After Breast Cancer Surgery/Treatment:  Lymphedema is a swelling condition that you may be at risk for in your arm if you have lymph nodes removed from the armpit area.  After a sentinel node biopsy, the risk is approximately 5-9% and is higher after an axillary node dissection.  There is treatment available for this condition and it is not life-threatening.   Contact your physician or physical therapist with concerns. You may begin the 4 shoulder/posture exercises (see additional sheet) when permitted by your physician (typically a week after surgery).  If you have drains, you may need to wait until those are removed before beginning range of motion exercises.  A general recommendation is to not lift your arms above shoulder height until drains are removed.  These exercises should be done to your tolerance and gently.  This is not a "no pain/no gain" type of recovery so listen to your body and stretch into the range of motion that you can tolerate, stopping if you have pain.  If you are having immediate reconstruction, ask your plastic surgeon about doing exercises as he or she may want you to wait. We encourage you to attend the free one time ABC (After Breast Cancer) class offered by [facility].  You will learn information related to lymphedema risk, prevention and treatment and additional exercises to regain mobility following surgery.  You can call [phone] for more information.  This is offered the 1st and 3rd Monday of each month.  You only attend the class one time. While undergoing any medical procedure or treatment, try to avoid blood pressure being taken or needle sticks from occurring on the arm on the side of cancer.   This recommendation begins after surgery and continues for the rest of your life.  This may help reduce your risk of getting lymphedema (swelling in your arm). An excellent resource for those seeking information on lymphedema is the National Lymphedema Network's web site. It can be accessed at [place].org If you notice swelling in your hand, arm or breast at any time following surgery (even if it is many years from now), please contact your doctor or physical therapist to discuss this.  Lymphedema can be  treated at any time but it is easier for you if it is treated early on.  If you feel like your shoulder  motion is not returning to normal in a reasonable amount of time, please contact your surgeon or physical therapist.  [facility] [phone]. [address], [address], [place] [address]  ABC CLASS After Breast Cancer Class  After Breast Cancer Class is a specially designed exercise class to assist you in a safe recover after having breast cancer surgery.  In this class you will learn how to get back to full function whether your drains were just removed or if you had surgery a month ago.  This one-time class is held the 1st and 3rd Monday of every month from 11:00 a.m. until 12:00 noon virtually.  This class is FREE and space is limited. For more information or to register for the next available class, call [phone].  Class Goals  Understand specific stretches to improve the flexibility of you chest and shoulder. Learn ways to safely strengthen your upper body and improve your posture. Understand the warning signs of infection and why you may be at risk for an arm infection. Learn about Lymphedema and prevention.  ** You do not attend this class until after surgery.  Drains must be removed to participate  Patient was instructed today in a home exercise program today for post op shoulder range of motion. These included active assist shoulder flexion in sitting, scapular retraction, wall walking with shoulder abduction, and hands behind head external rotation.  She was encouraged to do these twice a day, holding 3 seconds and repeating 5 times when permitted by her physician.    Stark Bray, PT 08/25/2022, 11:40 AM

## 2022-08-25 NOTE — Telephone Encounter (Signed)
Spoke to pt regarding navigation resources and provided contact information. Denies questions or concerns regarding dx or treatment care plan.  Confirmed sx date and future appts. Encourage pt to call with needs. Received verbal understanding.

## 2022-09-01 ENCOUNTER — Other Ambulatory Visit: Payer: Self-pay

## 2022-09-01 ENCOUNTER — Encounter (HOSPITAL_BASED_OUTPATIENT_CLINIC_OR_DEPARTMENT_OTHER): Payer: Self-pay | Admitting: General Surgery

## 2022-09-02 NOTE — Progress Notes (Signed)

## 2022-09-07 DIAGNOSIS — E559 Vitamin D deficiency, unspecified: Secondary | ICD-10-CM | POA: Diagnosis not present

## 2022-09-07 DIAGNOSIS — E7800 Pure hypercholesterolemia, unspecified: Secondary | ICD-10-CM | POA: Diagnosis not present

## 2022-09-07 DIAGNOSIS — R7989 Other specified abnormal findings of blood chemistry: Secondary | ICD-10-CM | POA: Diagnosis not present

## 2022-09-08 ENCOUNTER — Ambulatory Visit
Admission: RE | Admit: 2022-09-08 | Discharge: 2022-09-08 | Disposition: A | Discharge status: Home / Self Care | Payer: Medicare HMO | Source: Ambulatory Visit | Attending: General Surgery | Admitting: General Surgery

## 2022-09-08 DIAGNOSIS — C50912 Malignant neoplasm of unspecified site of left female breast: Secondary | ICD-10-CM | POA: Diagnosis not present

## 2022-09-08 DIAGNOSIS — Z170 Estrogen receptor positive status [ER+]: Secondary | ICD-10-CM

## 2022-09-08 HISTORY — PX: BREAST BIOPSY: SHX20

## 2022-09-08 NOTE — Anesthesia Preprocedure Evaluation (Signed)
Anesthesia Evaluation  Patient identified by MRN, date of birth, ID band Patient awake    Reviewed: Allergy & Precautions, NPO status , Patient's Chart, lab work & pertinent test results  Airway Mallampati: I  TM Distance: >3 FB Neck ROM: Full    Dental no notable dental hx. (+) Dental Advisory Given, Caps   Pulmonary neg pulmonary ROS   Pulmonary exam normal breath sounds clear to auscultation       Cardiovascular negative cardio ROS Normal cardiovascular exam Rhythm:Regular Rate:Normal     Neuro/Psych negative neurological ROS  negative psych ROS   GI/Hepatic negative GI ROS, Neg liver ROS,,,  Endo/Other  Left Breast Ca  Renal/GU negative Renal ROS  negative genitourinary   Musculoskeletal negative musculoskeletal ROS (+)    Abdominal   Peds  Hematology negative hematology ROS (+)   Anesthesia Other Findings   Reproductive/Obstetrics HPV anogenital                              Anesthesia Physical Anesthesia Plan  ASA: 2  Anesthesia Plan: General   Post-op Pain Management: Regional block* and Minimal or no pain anticipated   Induction: Intravenous  PONV Risk Score and Plan: Treatment may vary due to age or medical condition, Ondansetron, Dexamethasone and Midazolam  Airway Management Planned: LMA  Additional Equipment: None  Intra-op Plan:   Post-operative Plan: Extubation in OR  Informed Consent: I have reviewed the patients History and Physical, chart, labs and discussed the procedure including the risks, benefits and alternatives for the proposed anesthesia with the patient or authorized representative who has indicated his/her understanding and acceptance.     Dental advisory given  Plan Discussed with: CRNA and Anesthesiologist  Anesthesia Plan Comments:          Anesthesia Quick Evaluation

## 2022-09-09 ENCOUNTER — Ambulatory Visit (HOSPITAL_BASED_OUTPATIENT_CLINIC_OR_DEPARTMENT_OTHER): Payer: Medicare HMO | Admitting: Anesthesiology

## 2022-09-09 ENCOUNTER — Encounter (HOSPITAL_COMMUNITY)
Admission: RE | Admit: 2022-09-09 | Discharge: 2022-09-09 | Disposition: A | Discharge status: Home / Self Care | Payer: Medicare HMO | Source: Ambulatory Visit | Attending: General Surgery | Admitting: General Surgery

## 2022-09-09 ENCOUNTER — Ambulatory Visit
Admission: RE | Admit: 2022-09-09 | Discharge: 2022-09-09 | Disposition: A | Discharge status: Home / Self Care | Payer: Medicare HMO | Source: Ambulatory Visit | Attending: General Surgery | Admitting: General Surgery

## 2022-09-09 ENCOUNTER — Encounter (HOSPITAL_BASED_OUTPATIENT_CLINIC_OR_DEPARTMENT_OTHER): Payer: Self-pay | Admitting: General Surgery

## 2022-09-09 ENCOUNTER — Ambulatory Visit (HOSPITAL_BASED_OUTPATIENT_CLINIC_OR_DEPARTMENT_OTHER)
Admission: RE | Admit: 2022-09-09 | Discharge: 2022-09-09 | Disposition: A | Discharge status: Home / Self Care | Payer: Medicare HMO | Source: Ambulatory Visit | Attending: General Surgery | Admitting: General Surgery

## 2022-09-09 ENCOUNTER — Encounter (HOSPITAL_BASED_OUTPATIENT_CLINIC_OR_DEPARTMENT_OTHER)
Admission: RE | Disposition: A | Discharge status: Home / Self Care | Payer: Self-pay | Source: Ambulatory Visit | Attending: General Surgery

## 2022-09-09 ENCOUNTER — Other Ambulatory Visit: Payer: Self-pay

## 2022-09-09 DIAGNOSIS — C50912 Malignant neoplasm of unspecified site of left female breast: Secondary | ICD-10-CM

## 2022-09-09 DIAGNOSIS — Z170 Estrogen receptor positive status [ER+]: Secondary | ICD-10-CM | POA: Diagnosis not present

## 2022-09-09 DIAGNOSIS — G8918 Other acute postprocedural pain: Secondary | ICD-10-CM | POA: Diagnosis not present

## 2022-09-09 DIAGNOSIS — C50212 Malignant neoplasm of upper-inner quadrant of left female breast: Secondary | ICD-10-CM | POA: Diagnosis not present

## 2022-09-09 DIAGNOSIS — R928 Other abnormal and inconclusive findings on diagnostic imaging of breast: Secondary | ICD-10-CM | POA: Diagnosis not present

## 2022-09-09 DIAGNOSIS — Z01818 Encounter for other preprocedural examination: Secondary | ICD-10-CM

## 2022-09-09 HISTORY — PX: BREAST LUMPECTOMY WITH RADIOACTIVE SEED AND SENTINEL LYMPH NODE BIOPSY: SHX6550

## 2022-09-09 SURGERY — BREAST LUMPECTOMY WITH RADIOACTIVE SEED AND SENTINEL LYMPH NODE BIOPSY
Anesthesia: General | Site: Breast | Laterality: Left

## 2022-09-09 MED ORDER — CEFAZOLIN SODIUM-DEXTROSE 2-4 GM/100ML-% IV SOLN
INTRAVENOUS | Status: AC
  Filled 2022-09-09: qty 100

## 2022-09-09 MED ORDER — MIDAZOLAM HCL 2 MG/2ML IJ SOLN
INTRAMUSCULAR | Status: AC
  Filled 2022-09-09: qty 2

## 2022-09-09 MED ORDER — EPINEPHRINE PF 1 MG/ML IJ SOLN
INTRAMUSCULAR | Status: AC
  Filled 2022-09-09: qty 1

## 2022-09-09 MED ORDER — FENTANYL CITRATE (PF) 100 MCG/2ML IJ SOLN
INTRAMUSCULAR | Status: AC
  Filled 2022-09-09: qty 2

## 2022-09-09 MED ORDER — ACETAMINOPHEN 500 MG PO TABS
1000.0000 mg | ORAL_TABLET | ORAL | Status: AC
  Administered 2022-09-09: 1000 mg via ORAL

## 2022-09-09 MED ORDER — DEXAMETHASONE SODIUM PHOSPHATE 4 MG/ML IJ SOLN
INTRAMUSCULAR | Status: DC | PRN
  Administered 2022-09-09: 4 mg via INTRAVENOUS

## 2022-09-09 MED ORDER — BUPIVACAINE LIPOSOME 1.3 % IJ SUSP
INTRAMUSCULAR | Status: DC | PRN
  Administered 2022-09-09: 10 mL via PERINEURAL

## 2022-09-09 MED ORDER — BUPIVACAINE HCL (PF) 0.5 % IJ SOLN
INTRAMUSCULAR | Status: DC | PRN
  Administered 2022-09-09: 15 mL via PERINEURAL

## 2022-09-09 MED ORDER — LACTATED RINGERS IV SOLN: INTRAVENOUS | Status: DC

## 2022-09-09 MED ORDER — PROPOFOL 10 MG/ML IV BOLUS
INTRAVENOUS | Status: DC | PRN
  Administered 2022-09-09: 150 mg via INTRAVENOUS

## 2022-09-09 MED ORDER — ONDANSETRON HCL 4 MG/2ML IJ SOLN: 4.0000 mg | Freq: Once | INTRAMUSCULAR | Status: DC | PRN

## 2022-09-09 MED ORDER — MIDAZOLAM HCL 2 MG/2ML IJ SOLN
1.0000 mg | Freq: Once | INTRAMUSCULAR | Status: AC
  Administered 2022-09-09: 1 mg via INTRAVENOUS

## 2022-09-09 MED ORDER — FENTANYL CITRATE (PF) 100 MCG/2ML IJ SOLN: 25.0000 ug | INTRAMUSCULAR | Status: DC | PRN

## 2022-09-09 MED ORDER — OXYCODONE HCL 5 MG PO TABS: 5.0000 mg | ORAL_TABLET | Freq: Four times a day (QID) | ORAL | 0 refills | Status: DC | PRN

## 2022-09-09 MED ORDER — BUPIVACAINE HCL (PF) 0.25 % IJ SOLN
INTRAMUSCULAR | Status: DC | PRN
  Administered 2022-09-09: 20 mL

## 2022-09-09 MED ORDER — FENTANYL CITRATE (PF) 100 MCG/2ML IJ SOLN
50.0000 ug | Freq: Once | INTRAMUSCULAR | Status: AC
  Administered 2022-09-09: 50 ug via INTRAVENOUS

## 2022-09-09 MED ORDER — TECHNETIUM TC 99M TILMANOCEPT KIT
1.0000 | PACK | Freq: Once | INTRAVENOUS | Status: AC | PRN
  Administered 2022-09-09: 1 via INTRADERMAL

## 2022-09-09 MED ORDER — CHLORHEXIDINE GLUCONATE CLOTH 2 % EX PADS: 6.0000 | MEDICATED_PAD | Freq: Once | CUTANEOUS | Status: DC

## 2022-09-09 MED ORDER — PROPOFOL 500 MG/50ML IV EMUL
INTRAVENOUS | Status: DC | PRN
  Administered 2022-09-09: 25 ug/kg/min via INTRAVENOUS

## 2022-09-09 MED ORDER — ONDANSETRON HCL 4 MG/2ML IJ SOLN
INTRAMUSCULAR | Status: DC | PRN
  Administered 2022-09-09: 4 mg via INTRAVENOUS

## 2022-09-09 MED ORDER — LIDOCAINE HCL (CARDIAC) PF 100 MG/5ML IV SOSY
PREFILLED_SYRINGE | INTRAVENOUS | Status: DC | PRN
  Administered 2022-09-09: 20 mg via INTRAVENOUS

## 2022-09-09 MED ORDER — GABAPENTIN 300 MG PO CAPS
300.0000 mg | ORAL_CAPSULE | ORAL | Status: AC
  Administered 2022-09-09: 300 mg via ORAL

## 2022-09-09 MED ORDER — OXYCODONE HCL 5 MG PO TABS: 5.0000 mg | ORAL_TABLET | Freq: Once | ORAL | Status: DC | PRN

## 2022-09-09 MED ORDER — FENTANYL CITRATE (PF) 100 MCG/2ML IJ SOLN
INTRAMUSCULAR | Status: DC | PRN
  Administered 2022-09-09: 25 ug via INTRAVENOUS

## 2022-09-09 MED ORDER — BUPIVACAINE HCL (PF) 0.25 % IJ SOLN
INTRAMUSCULAR | Status: AC
  Filled 2022-09-09: qty 30

## 2022-09-09 MED ORDER — OXYCODONE HCL 5 MG/5ML PO SOLN: 5.0000 mg | Freq: Once | ORAL | Status: DC | PRN

## 2022-09-09 MED ORDER — AMISULPRIDE (ANTIEMETIC) 5 MG/2ML IV SOLN: 10.0000 mg | Freq: Once | INTRAVENOUS | Status: DC | PRN

## 2022-09-09 MED ORDER — PHENYLEPHRINE HCL (PRESSORS) 10 MG/ML IV SOLN
INTRAVENOUS | Status: DC | PRN
  Administered 2022-09-09: 40 ug via INTRAVENOUS
  Administered 2022-09-09: 80 ug via INTRAVENOUS

## 2022-09-09 MED ORDER — CEFAZOLIN SODIUM-DEXTROSE 2-4 GM/100ML-% IV SOLN
2.0000 g | INTRAVENOUS | Status: AC
  Administered 2022-09-09: 2 g via INTRAVENOUS

## 2022-09-09 MED ORDER — ACETAMINOPHEN 500 MG PO TABS
ORAL_TABLET | ORAL | Status: AC
  Filled 2022-09-09: qty 2

## 2022-09-09 MED ORDER — GABAPENTIN 300 MG PO CAPS
ORAL_CAPSULE | ORAL | Status: AC
  Filled 2022-09-09: qty 1

## 2022-09-09 SURGICAL SUPPLY — 43 items
ADH SKN CLS APL DERMABOND .7 (GAUZE/BANDAGES/DRESSINGS) ×1
APL PRP STRL LF DISP 70% ISPRP (MISCELLANEOUS) ×1
APPLIER CLIP 9.375 MED OPEN (MISCELLANEOUS) ×1
APR CLP MED 9.3 20 MLT OPN (MISCELLANEOUS) ×1
BLADE SURG 15 STRL LF DISP TIS (BLADE) ×1 IMPLANT
BLADE SURG 15 STRL SS (BLADE) ×1
CANISTER SUC SOCK COL 7IN (MISCELLANEOUS) IMPLANT
CANISTER SUCT 1200ML W/VALVE (MISCELLANEOUS) IMPLANT
CHLORAPREP W/TINT 26 (MISCELLANEOUS) ×1 IMPLANT
CLIP APPLIE 9.375 MED OPEN (MISCELLANEOUS) ×1 IMPLANT
COVER BACK TABLE 60X90IN (DRAPES) ×1 IMPLANT
COVER MAYO STAND STRL (DRAPES) ×1 IMPLANT
COVER PROBE CYLINDRICAL 5X96 (MISCELLANEOUS) ×1 IMPLANT
DERMABOND ADVANCED .7 DNX12 (GAUZE/BANDAGES/DRESSINGS) ×1 IMPLANT
DRAPE LAPAROSCOPIC ABDOMINAL (DRAPES) ×1 IMPLANT
DRAPE UTILITY XL STRL (DRAPES) ×1 IMPLANT
ELECT COATED BLADE 2.86 ST (ELECTRODE) ×1 IMPLANT
ELECT REM PT RETURN 9FT ADLT (ELECTROSURGICAL) ×1
ELECTRODE REM PT RTRN 9FT ADLT (ELECTROSURGICAL) ×1 IMPLANT
GLOVE BIO SURGEON STRL SZ7.5 (GLOVE) ×1 IMPLANT
GOWN STRL REUS W/ TWL LRG LVL3 (GOWN DISPOSABLE) ×2 IMPLANT
GOWN STRL REUS W/TWL LRG LVL3 (GOWN DISPOSABLE) ×2
ILLUMINATOR WAVEGUIDE N/F (MISCELLANEOUS) IMPLANT
KIT MARKER MARGIN INK (KITS) ×1 IMPLANT
LIGHT WAVEGUIDE WIDE FLAT (MISCELLANEOUS) IMPLANT
NDL HYPO 25X1 1.5 SAFETY (NEEDLE) ×1 IMPLANT
NDL SAFETY ECLIP 18X1.5 (MISCELLANEOUS) IMPLANT
NEEDLE HYPO 25X1 1.5 SAFETY (NEEDLE) ×1 IMPLANT
NS IRRIG 1000ML POUR BTL (IV SOLUTION) IMPLANT
PACK BASIN DAY SURGERY FS (CUSTOM PROCEDURE TRAY) ×1 IMPLANT
PENCIL SMOKE EVACUATOR (MISCELLANEOUS) ×1 IMPLANT
SLEEVE SCD COMPRESS KNEE MED (STOCKING) ×1 IMPLANT
SPIKE FLUID TRANSFER (MISCELLANEOUS) IMPLANT
SPONGE T-LAP 18X18 ~~LOC~~+RFID (SPONGE) ×1 IMPLANT
SUT MON AB 4-0 PC3 18 (SUTURE) ×2 IMPLANT
SUT SILK 2 0 SH (SUTURE) IMPLANT
SUT VICRYL 3-0 CR8 SH (SUTURE) ×1 IMPLANT
SYR CONTROL 10ML LL (SYRINGE) ×1 IMPLANT
TOWEL GREEN STERILE FF (TOWEL DISPOSABLE) ×1 IMPLANT
TRACER MAGTRACE VIAL (MISCELLANEOUS) IMPLANT
TRAY FAXITRON CT DISP (TRAY / TRAY PROCEDURE) ×1 IMPLANT
TUBE CONNECTING 20X1/4 (TUBING) IMPLANT
YANKAUER SUCT BULB TIP NO VENT (SUCTIONS) IMPLANT

## 2022-09-09 NOTE — Discharge Instructions (Addendum)
  Post Anesthesia Home Care Instructions  Activity: Get plenty of rest for the remainder of the day. A responsible individual must stay with you for 24 hours following the procedure.  For the next 24 hours, DO NOT: -Drive a car -Paediatric nurse -Drink alcoholic beverages -Take any medication unless instructed by your physician -Make any legal decisions or sign important papers.  Meals: Start with liquid foods such as gelatin or soup. Progress to regular foods as tolerated. Avoid greasy, spicy, heavy foods. If nausea and/or vomiting occur, drink only clear liquids until the nausea and/or vomiting subsides. Call your physician if vomiting continues.  Special Instructions/Symptoms: Your throat may feel dry or sore from the anesthesia or the breathing tube placed in your throat during surgery. If this causes discomfort, gargle with warm salt water. The discomfort should disappear within 24 hours.  Tylenol can be taken after 2 pm  Information for Discharge Teaching: EXPAREL (bupivacaine liposome injectable suspension)   Your surgeon or anesthesiologist gave you EXPAREL(bupivacaine) to help control your pain after surgery.  EXPAREL is a local anesthetic that provides pain relief by numbing the tissue around the surgical site. EXPAREL is designed to release pain medication over time and can control pain for up to 72 hours. Depending on how you respond to EXPAREL, you may require less pain medication during your recovery.  Possible side effects: Temporary loss of sensation or ability to move in the area where bupivacaine was injected. Nausea, vomiting, constipation Rarely, numbness and tingling in your mouth or lips, lightheadedness, or anxiety may occur. Call your doctor right away if you think you may be experiencing any of these sensations, or if you have other questions regarding possible side effects.  Follow all other discharge instructions given to you by your surgeon or nurse. Eat  a healthy diet and drink plenty of water or other fluids.  If you return to the hospital for any reason within 96 hours following the administration of EXPAREL, it is important for health care providers to know that you have received this anesthetic. A teal colored band has been placed on your arm with the date, time and amount of EXPAREL you have received in order to alert and inform your health care providers. Please leave this armband in place for the full 96 hours following administration, and then you may remove the band.

## 2022-09-09 NOTE — Anesthesia Procedure Notes (Signed)
Anesthesia Regional Block: Pectoralis block   Pre-Anesthetic Checklist: , timeout performed,  Correct Patient, Correct Site, Correct Laterality,  Correct Procedure, Correct Position, site marked,  Risks and benefits discussed,  Surgical consent,  Pre-op evaluation,  At surgeon's request and post-op pain management  Laterality: Left  Prep: chloraprep       Needles:  Injection technique: Single-shot  Needle Type: Echogenic Stimulator Needle     Needle Length: 10cm  Needle Gauge: 21   Needle insertion depth: 6 cm   Additional Needles:   Procedures:,,,, ultrasound used (permanent image in chart),,    Narrative:  Start time: 09/09/2022 8:59 AM End time: 09/09/2022 9:04 AM Injection made incrementally with aspirations every 5 mL.  Performed by: Personally  Anesthesiologist: Josephine Igo, MD  Additional Notes: Timeout performed. Patient sedated. Relevant anatomy ID'd using [place]. Incremental 2-58m injection of LA with frequent aspiration. Patient tolerated procedure well.

## 2022-09-09 NOTE — Transfer of Care (Signed)
Immediate Anesthesia Transfer of Care Note  Patient: Karina Wise  Procedure(s) Performed: LEFT BREAST LUMPECTOMY WITH RADIOACTIVE SEED AND SENTINEL LYMPH NODE BIOPSY (Left: Breast)  Patient Location: PACU  Anesthesia Type:GA combined with regional for post-op pain  Level of Consciousness: drowsy and patient cooperative  Airway & Oxygen Therapy: Patient Spontanous Breathing and Patient connected to face mask oxygen  Post-op Assessment: Report given to RN and Post -op Vital signs reviewed and stable  Post vital signs: Reviewed and stable  Last Vitals:  Vitals Value Taken Time  BP 138/87 09/09/22 1049  Temp    Pulse 120 09/09/22 1049  Resp 16 09/09/22 1049  SpO2 95 % 09/09/22 1049    Last Pain:  Vitals:   09/09/22 0755  TempSrc: Oral  PainSc: 0-No pain      Patients Stated Pain Goal: 4 [id number] [phone]  Complications: No notable events documented.

## 2022-09-09 NOTE — Op Note (Signed)
09/09/2022  10:42 AM  PATIENT:  Karina Wise  67 y.o. female  PRE-OPERATIVE DIAGNOSIS:  LEFT BREAST CANCER  POST-OPERATIVE DIAGNOSIS:  LEFT BREAST CANCER  PROCEDURE:  Procedure(s): LEFT BREAST LUMPECTOMY WITH RADIOACTIVE SEED AND SENTINEL LYMPH NODE BIOPSY (Left)  SURGEON:  Surgeon(s) and Role:    * Jovita Kussmaul, MD - Primary  PHYSICIAN ASSISTANT:   ASSISTANTS: none   ANESTHESIA:   local and general  EBL:  minimal   BLOOD ADMINISTERED:none  DRAINS: none   LOCAL MEDICATIONS USED:  MARCAINE     SPECIMEN:  Source of Specimen:  left breast tissue and sentinel node  DISPOSITION OF SPECIMEN:  PATHOLOGY  COUNTS:  YES  TOURNIQUET:  * No tourniquets in log *  DICTATION: .Dragon Dictation  After informed consent was obtained the patient was brought to the operating room and placed in the supine position on the operating table.  After adequate induction of general anesthesia the patient's left chest, breast, and axillary area were prepped with ChloraPrep, allowed to dry, and draped in usual sterile manner.  An appropriate timeout was performed.  Previously an I-125 seed was placed in the upper portion of the left breast to mark an area of invasive breast cancer.  Also earlier in the day the patient underwent injection of 1 mCi of technetium sulfur colloid in the subareolar position on the left.  The neoprobe was set to I-125 in the area of radioactivity was readily identified.  Because this was fairly close to the axilla I elected to do both parts of the operation through the 1 incision.  The area around the radioactivity was infiltrated with quarter percent Marcaine.  An elliptical incision was made in the skin overlying the radioactivity with a 15 blade knife.  The incision was carried through the skin and subcutaneous tissue sharply with the electrocautery.  Dissection was then carried widely around the radioactive seed while checking the area of radioactivity frequently.  This  dissection was carried all the way to the muscle of the chest wall.  Once this was accomplished then the specimen was removed from the patient.  It was oriented with the appropriate paint colors.  A specimen radiograph was obtained that showed the clip and seed to be near the center of the specimen.  The specimen was then sent to pathology for further evaluation.  The neoprobe was then set to technetium.  I was able to enter the deep left axillary space through the lumpectomy incision sharply with the electrocautery.  Dissection was then carried under the direction of the neoprobe into the deep left axillary space.  I was able to identify a significant lymph node with radioactivity.  This lymph node was excised sharply with the electrocautery and the surrounding small vessels and lymphatics were controlled with clips.  Ex vivo counts on this node was approximately 2000.  No other hot or palpable nodes were identified in the left axilla.  Some additional left axillary tissue was also sent to pathology.  Hemostasis was achieved using the Bovie electrocautery.  The deep left axillary space was then closed with interrupted 3-0 Vicryl stitches.  The wound was then irrigated with saline and infiltrated with more quarter percent Marcaine.  The deep layer of the incision was then closed with interrupted 3-0 Vicryl stitches.  The skin was then closed with a running 4-0 Monocryl subcuticular stitch.  Dermabond dressings were applied.  The patient tolerated the procedure well.  At the end of the case all  needle sponge and instrument counts were correct.  The patient was then awakened and taken to recovery in stable condition.  PLAN OF CARE: Discharge to home after PACU  PATIENT DISPOSITION:  PACU - hemodynamically stable.   Delay start of Pharmacological VTE agent (>24hrs) due to surgical blood loss or risk of bleeding: not applicable

## 2022-09-09 NOTE — Anesthesia Procedure Notes (Signed)
Procedure Name: LMA Insertion Date/Time: 09/09/2022 9:42 AM  Performed by: Gursimran Litaker, Ernesta Amble, CRNAPre-anesthesia Checklist: Patient identified, Emergency Drugs available, Suction available and Patient being monitored Patient Re-evaluated:Patient Re-evaluated prior to induction Oxygen Delivery Method: Circle system utilized Preoxygenation: Pre-oxygenation with 100% oxygen Induction Type: IV induction Ventilation: Mask ventilation without difficulty LMA: LMA inserted LMA Size: 4.0 Number of attempts: 1 Airway Equipment and Method: Bite block Placement Confirmation: positive ETCO2 Tube secured with: Tape Dental Injury: Teeth and Oropharynx as per pre-operative assessment

## 2022-09-09 NOTE — H&P (Signed)
REFERRING PHYSICIAN: Domenick Gong, MD  PROVIDER: Landry Corporal, MD  MRN: [id number] DOB: [birth year] Subjective   Chief Complaint: New Consultation (Left Breast )   History of Present Illness: Karina Wise is a 67 y.o. female who is seen today as an office consultation for evaluation of New Consultation (Left Breast ) .   We are asked to see the patient in consultation by [name] to evaluate her for a new left breast cancer. The patient is a 67 year old white female who recently went for a routine screening mammogram. At that time she was found to have a 1.6 cm mass high in the upper inner quadrant of the left breast. The axilla looked normal. 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 She does not know of any family history of breast cancer. She is otherwise in good health and does not smoke.  Review of Systems: A complete review of systems was obtained from the patient. I have reviewed this information and discussed as appropriate with the patient. See HPI as well for other ROS.  ROS   Medical History: Past Medical History:  Diagnosis Date  Arthritis  Hypertension   There is no problem list on file for this patient.  Past Surgical History:  Procedure Laterality Date  APPENDECTOMY 1965    No Known Allergies  Current Outpatient Medications on File Prior to Visit  Medication Sig Dispense Refill  simvastatin (ZOCOR) 20 MG tablet Take 20 mg by mouth once daily   No current facility-administered medications on file prior to visit.   History reviewed. No pertinent family history.   Social History   Tobacco Use  Smoking Status Never  Smokeless Tobacco Never    Social History   Socioeconomic History  Marital status: Married  Tobacco Use  Smoking status: Never  Smokeless tobacco: Never  Substance and Sexual Activity  Alcohol use: Yes  Drug use: Never    Objective:   Vitals:  BP: (!) 163/116  Pulse: (!) 139  Temp: 36.2 C (97.1 F)  SpO2: 97%  Weight: 63.5 kg (140 lb)  Height: 165.1 cm (5' 5"$ )  PainSc: 0-No pain  PainLoc: Breast   Body mass index is 23.3 kg/m.  Physical Exam Vitals reviewed.  Constitutional:  General: She is not in acute distress. Appearance: Normal appearance.  HENT:  Head: Normocephalic and atraumatic.  Right Ear: External ear normal.  Left Ear: External ear normal.  Nose: Nose normal.  Mouth/Throat:  Mouth: Mucous membranes are moist.  Pharynx: Oropharynx is clear.  Eyes:  General: No scleral icterus. Extraocular Movements: Extraocular movements intact.  Conjunctiva/sclera: Conjunctivae normal.  Pupils: Pupils are equal, round, and reactive to light.  Cardiovascular:  Rate and Rhythm: Normal rate and regular rhythm.  Pulses: Normal pulses.  Heart sounds: Normal heart sounds.  Pulmonary:  Effort: Pulmonary effort is normal. No respiratory distress.  Breath sounds: Normal breath sounds.  Abdominal:  General: Bowel sounds are normal.  Palpations: Abdomen is soft.  Tenderness: There is no abdominal tenderness.  Musculoskeletal:  General: No swelling, tenderness or deformity. Normal range of motion.  Cervical back: Normal range of motion and neck supple.  Skin: General: Skin is warm and dry.  Coloration: Skin is not jaundiced.  Neurological:  General: No focal deficit present.  Mental Status: She is alert and oriented to person, place, and time.  Psychiatric:  Mood and Affect:  Mood normal.  Behavior: Behavior normal.     Breast: There is a roughly 1-1/2 cm palpable mass in the upper portion of the left breast. There is no other palpable mass in either breast. There is no palpable axillary, supraclavicular, or cervical lymphadenopathy.  Labs, Imaging and Diagnostic Testing:  Assessment and Plan:   Diagnoses and all orders for this visit:  Malignant neoplasm of upper-inner quadrant  of left breast in female, estrogen receptor positive  - Ambulatory Referral to Oncology-Medical - Ambulatory Referral to Physical Therapy - Ambulatory Referral to Radiation Oncology - [facility] Case Posting Request; Future    The patient appears to have a 1.6 cm invasive papillary cancer in the upper inner portion of the left breast with all favorable markers and clinically negative nodes. I have discussed with her in detail the different options for treatment and at this point she favors breast conservation which I feel is very reasonable. She is also a good candidate for sentinel node biopsy. I have discussed with her in detail the risks and benefits of the operation as well as some of the technical aspects including the use of a radioactive seed for localization and she understands and wishes to proceed. I will go ahead and refer her to medical and radiation oncology to discuss adjuvant therapy. I will also refer her to physical therapy for preoperative lymphedema testing. We will move forward with surgical scheduling

## 2022-09-09 NOTE — Interval H&P Note (Signed)
History and Physical Interval Note:  09/09/2022 8:06 AM  Karina Wise  has presented today for surgery, with the diagnosis of LEFT BREAST CANCER.  The various methods of treatment have been discussed with the patient and family. After consideration of risks, benefits and other options for treatment, the patient has consented to  Procedure(s): LEFT BREAST LUMPECTOMY WITH RADIOACTIVE SEED AND SENTINEL LYMPH NODE BIOPSY (Left) as a surgical intervention.  The patient's history has been reviewed, patient examined, no change in status, stable for surgery.  I have reviewed the patient's chart and labs.  Questions were answered to the patient's satisfaction.     [name]

## 2022-09-09 NOTE — Progress Notes (Signed)
Emotional support during breast injections °

## 2022-09-09 NOTE — Anesthesia Postprocedure Evaluation (Signed)
Anesthesia Post Note  Patient: Karina Wise  Procedure(s) Performed: LEFT BREAST LUMPECTOMY WITH RADIOACTIVE SEED AND SENTINEL LYMPH NODE BIOPSY (Left: Breast)     Patient location during evaluation: PACU Anesthesia Type: General Level of consciousness: awake and alert and oriented Pain management: pain level controlled Vital Signs Assessment: post-procedure vital signs reviewed and stable Respiratory status: spontaneous breathing, nonlabored ventilation and respiratory function stable Cardiovascular status: blood pressure returned to baseline and stable Postop Assessment: no apparent nausea or vomiting Anesthetic complications: no   No notable events documented.  Last Vitals:  Vitals:   09/09/22 1049 09/09/22 1100  BP: 138/87 127/85  Pulse: (!) 120 (!) 113  Resp: 16 20  Temp: (!) 36.3 C   SpO2: 95% 99%    Last Pain:  Vitals:   09/09/22 1100  TempSrc:   PainSc: 2                  Kaemon Barnett A.

## 2022-09-09 NOTE — Progress Notes (Signed)
Assisted Dr. Royce Macadamia with left, pectoralis, ultrasound guided block. Side rails up, monitors on throughout procedure. See vital signs in flow sheet. Tolerated Procedure well.

## 2022-09-10 ENCOUNTER — Encounter (HOSPITAL_BASED_OUTPATIENT_CLINIC_OR_DEPARTMENT_OTHER): Payer: Self-pay | Admitting: General Surgery

## 2022-09-11 DIAGNOSIS — C50912 Malignant neoplasm of unspecified site of left female breast: Secondary | ICD-10-CM | POA: Diagnosis not present

## 2022-09-11 LAB — SURGICAL PATHOLOGY

## 2022-09-14 DIAGNOSIS — R85612 Low grade squamous intraepithelial lesion on cytologic smear of anus (LGSIL): Secondary | ICD-10-CM | POA: Diagnosis not present

## 2022-09-14 DIAGNOSIS — E7800 Pure hypercholesterolemia, unspecified: Secondary | ICD-10-CM | POA: Diagnosis not present

## 2022-09-14 DIAGNOSIS — R82998 Other abnormal findings in urine: Secondary | ICD-10-CM | POA: Diagnosis not present

## 2022-09-14 DIAGNOSIS — Z0000 Encounter for general adult medical examination without abnormal findings: Secondary | ICD-10-CM | POA: Diagnosis not present

## 2022-09-14 DIAGNOSIS — C50212 Malignant neoplasm of upper-inner quadrant of left female breast: Secondary | ICD-10-CM | POA: Diagnosis not present

## 2022-09-14 DIAGNOSIS — Z1331 Encounter for screening for depression: Secondary | ICD-10-CM | POA: Diagnosis not present

## 2022-09-14 DIAGNOSIS — E559 Vitamin D deficiency, unspecified: Secondary | ICD-10-CM | POA: Diagnosis not present

## 2022-09-14 DIAGNOSIS — Z1212 Encounter for screening for malignant neoplasm of rectum: Secondary | ICD-10-CM | POA: Diagnosis not present

## 2022-09-14 DIAGNOSIS — Z1339 Encounter for screening examination for other mental health and behavioral disorders: Secondary | ICD-10-CM | POA: Diagnosis not present

## 2022-09-14 DIAGNOSIS — D126 Benign neoplasm of colon, unspecified: Secondary | ICD-10-CM | POA: Diagnosis not present

## 2022-09-16 NOTE — Progress Notes (Signed)
   Patient Care Team: Tisovec, Fransico Him, MD as PCP - General (Internal Medicine) Mauro Kaufmann, RN as Oncology Nurse Navigator Rockwell Germany, RN as Oncology Nurse Navigator Nicholas Lose, MD as Consulting Physician (Hematology and Oncology)  DIAGNOSIS: No diagnosis found.  SUMMARY OF ONCOLOGIC HISTORY: Oncology History  Malignant neoplasm of upper-inner quadrant of left breast in female, estrogen receptor positive ([place])  08/05/2022 Initial Diagnosis   Screening mammogram detected left breast mass by ultrasound measured 1.6 cm, axilla negative, biopsy revealed grade 2 invasive papillary carcinoma ER 100%, PR 80%, Ki-67 5%, HER2 0 negative   08/20/2022 Cancer Staging   Staging form: Breast, AJCC 8th Edition - Clinical: Stage IA (cT1c, cN0, cM0, G2, ER+, PR+, HER2-) - Signed by Nicholas Lose, MD on 08/20/2022 Histologic grading system: 3 grade system     CHIEF COMPLIANT: Follow-up after surgery  INTERVAL HISTORY: Karina Wise is a 67 y.o. female is here because of recent diagnosis of left breast cancer.   She presents to the clinic for a follow-up to discuss final pathology report and then determine if Oncotype DX testing will need to be sent.     ALLERGIES:  has No Known Allergies.  MEDICATIONS:  Current Outpatient Medications  Medication Sig Dispense Refill   B Complex Vitamins (VITAMIN B COMPLEX PO) Take by mouth.     cholecalciferol (VITAMIN D3) 25 MCG (1000 UT) tablet Take 1,000 Units by mouth daily.     oxyCODONE (ROXICODONE) 5 MG immediate release tablet Take 1 tablet (5 mg total) by mouth every 6 (six) hours as needed for severe pain. 10 tablet 0   simvastatin (ZOCOR) 20 MG tablet Take 20 mg by mouth daily.     No current facility-administered medications for this visit.    PHYSICAL EXAMINATION: ECOG PERFORMANCE STATUS: {CHL ONC ECOG PS:[id number]}  There were no vitals filed for this visit. There were no vitals filed for this visit.  BREAST:*** No  palpable masses or nodules in either right or left breasts. No palpable axillary supraclavicular or infraclavicular adenopathy no breast tenderness or nipple discharge. (exam performed in the presence of a chaperone)  LABORATORY DATA:  I have reviewed the data as listed     No data to display          No results found for: "WBC", "HGB", "HCT", "MCV", "PLT", "NEUTROABS"  ASSESSMENT & PLAN:  No problem-specific Assessment & Plan notes found for this encounter.    No orders of the defined types were placed in this encounter.  The patient has a good understanding of the overall plan. she agrees with it. she will call with any problems that may develop before the next visit here. Total time spent: 30 mins including face to face time and time spent for planning, charting and co-ordination of care   Suzzette Righter, [place] 09/16/22    I Gardiner Coins am acting as a Education administrator for [employer]  ***

## 2022-09-17 ENCOUNTER — Encounter (HOSPITAL_COMMUNITY): Payer: Self-pay

## 2022-09-17 ENCOUNTER — Encounter: Payer: Self-pay | Admitting: *Deleted

## 2022-09-17 ENCOUNTER — Other Ambulatory Visit: Payer: Medicare HMO

## 2022-09-17 ENCOUNTER — Telehealth: Payer: Self-pay | Admitting: *Deleted

## 2022-09-17 ENCOUNTER — Inpatient Hospital Stay: Payer: Medicare HMO | Attending: Hematology and Oncology | Admitting: Hematology and Oncology

## 2022-09-17 VITALS — BP 148/96 | HR 111 | Temp 97.6°F | Resp 18 | Ht 65.0 in | Wt 140.8 lb

## 2022-09-17 DIAGNOSIS — C50212 Malignant neoplasm of upper-inner quadrant of left female breast: Secondary | ICD-10-CM | POA: Diagnosis not present

## 2022-09-17 DIAGNOSIS — Z170 Estrogen receptor positive status [ER+]: Secondary | ICD-10-CM | POA: Insufficient documentation

## 2022-09-17 DIAGNOSIS — Z79899 Other long term (current) drug therapy: Secondary | ICD-10-CM | POA: Diagnosis not present

## 2022-09-17 NOTE — Assessment & Plan Note (Signed)
09/09/2022:Left lumpectomy: Grade 2 invasive papillary carcinoma 1.3 cm, low-grade DCIS, margins negative, 0/4 lymph nodes negative, ER 100%, PR 80%, HER2 0, Ki-67 5%  Pathology counseling: I discussed the final pathology report of the patient provided  a copy of this report. I discussed the margins as well as lymph node surgeries. We also discussed the final staging along with previously performed ER/PR and HER-2/neu testing.  Treatment plan: Oncotype DX testing to determine if chemotherapy would be of any benefit followed by Adjuvant radiation therapy followed by Adjuvant antiestrogen therapy  Return to clinic based upon Oncotype DX test result

## 2022-09-17 NOTE — Telephone Encounter (Signed)
Ordered oncotype per Dr. Lindi Adie. Sent requisition to pathology and exact sciences.

## 2022-09-21 ENCOUNTER — Inpatient Hospital Stay (HOSPITAL_BASED_OUTPATIENT_CLINIC_OR_DEPARTMENT_OTHER): Payer: Medicare HMO | Admitting: Licensed Clinical Social Worker

## 2022-09-21 ENCOUNTER — Inpatient Hospital Stay: Payer: Medicare HMO

## 2022-09-21 ENCOUNTER — Encounter: Payer: Self-pay | Admitting: Licensed Clinical Social Worker

## 2022-09-21 DIAGNOSIS — Z806 Family history of leukemia: Secondary | ICD-10-CM

## 2022-09-21 DIAGNOSIS — C50212 Malignant neoplasm of upper-inner quadrant of left female breast: Secondary | ICD-10-CM | POA: Diagnosis not present

## 2022-09-21 DIAGNOSIS — Z170 Estrogen receptor positive status [ER+]: Secondary | ICD-10-CM

## 2022-09-21 DIAGNOSIS — Z801 Family history of malignant neoplasm of trachea, bronchus and lung: Secondary | ICD-10-CM

## 2022-09-21 NOTE — Progress Notes (Signed)
REFERRING PROVIDER: Hayden Pedro, PA-C [place],  [place] [address]  PRIMARY PROVIDER:  Tisovec, Fransico Him, MD  PRIMARY REASON FOR VISIT:  1. Malignant neoplasm of upper-inner quadrant of left breast in female, estrogen receptor positive ([place])   2. Family history of lung cancer   3. Family history of leukemia      HISTORY OF PRESENT ILLNESS:   Karina Wise, a 67 y.o. female, was seen for a [facility] cancer genetics consultation at the request of Shona Simpson PA-C due to a personal and family history of cancer.  Karina Wise presents to clinic today to discuss the possibility of a hereditary predisposition to cancer, genetic testing, and to further clarify her future cancer risks, as well as potential cancer risks for family members.   CANCER HISTORY:  In 2024, at the age of 39, Karina Wise was diagnosed with invasive papillary carcinoma of the left breast, ER/PR+ HER2-. The treatment plan includes lumpectomy, completed 09/09/2022, Oncotype and adjuvant radiation.   Oncology History  Malignant neoplasm of upper-inner quadrant of left breast in female, estrogen receptor positive ([place])  08/05/2022 Initial Diagnosis   Screening mammogram detected left breast mass by ultrasound measured 1.6 cm, axilla negative, biopsy revealed grade 2 invasive papillary carcinoma ER 100%, PR 80%, Ki-67 5%, HER2 0 negative   08/20/2022 Cancer Staging   Staging form: Breast, AJCC 8th Edition - Clinical: Stage IA (cT1c, cN0, cM0, G2, ER+, PR+, HER2-) - Signed by Nicholas Lose, MD on 08/20/2022 Histologic grading system: 3 grade system   09/09/2022 Surgery   Left lumpectomy: Grade 2 invasive papillary carcinoma 1.3 cm, low-grade DCIS, margins negative, 0/4 lymph nodes negative, ER 100%, PR 80%, HER2 0, Ki-67 5%    RISK FACTORS:  Menarche was at age 42.  OCP use for approximately 0 years.  Ovaries intact: yes.  Hysterectomy: no.  Menopausal status: postmenopausal.  HRT use: 0  years. Colonoscopy: yes;  reports benign polyps .   Past Medical History:  Diagnosis Date   Breast cancer ([place]) 08/05/2022   HPV (human papilloma virus) anogenital infection     Past Surgical History:  Procedure Laterality Date   APPENDECTOMY  1965   BREAST BIOPSY Left 2020   benign; [facility]   BREAST BIOPSY Left 08/05/2022   [place] LT BREAST BX W LOC DEV 1ST LESION IMG BX SPEC US GUIDE 08/05/2022 [facility]   BREAST BIOPSY  09/08/2022   MM LT RADIOACTIVE SEED LOC MAMMO GUIDE 09/08/2022 [facility]   BREAST LUMPECTOMY WITH RADIOACTIVE SEED AND SENTINEL LYMPH NODE BIOPSY Left 09/09/2022   Procedure: LEFT BREAST LUMPECTOMY WITH RADIOACTIVE SEED AND SENTINEL LYMPH NODE BIOPSY;  Surgeon: Jovita Kussmaul, MD;  Location: [facility];  Service: General;  Laterality: Left;    FAMILY HISTORY:  We obtained a detailed, 4-generation family history.  Significant diagnoses are listed below: Family History  Problem Relation Age of Onset   Lung cancer Mother 68   Diabetes Mother    Hypertension Mother    Liver cancer Mother 58       + poss. breast ca   Leukemia Sister 29       LGL   Lung cancer Cousin 92   Head & neck cancer Cousin 82   Karina Wise has 1 sister, 26, who was diagnosed with LGL leukemia at 53.   Karina Wise mother had lung cancer at 12, liver at 2 and possibly breast cancer as well. Maternal cousin had head and  neck cancer in his 59s.   Karina Wise father died at 86 of heart disease. Patient had a paternal cousin who had lung cancer at 55. Paternal grandmother had Paget's disease.  Karina Wise is unaware of previous family history of genetic testing for hereditary cancer risks. There is reported Ashkenazi Jewish ancestry on both sides of the family. There is no known consanguinity.    GENETIC COUNSELING ASSESSMENT: Karina Wise is a 67 y.o. female with a personal history of breast cancer and Ashkenazi Jewish ancestry which is somewhat suggestive of a  hereditary cancer syndrome and predisposition to cancer. We, therefore, discussed and recommended the following at today's visit.   DISCUSSION: We discussed that approximately 10% of breast cancer is hereditary. Most cases of hereditary breast cancer are associated with BRCA1/BRCA2 genes, although there are other genes associated with hereditary cancer as well. Certain mutations in BRCA1/BRCA2 genes are more common in those with Ashkenazi Jewish ancestry. Cancers and risks are gene specific. We discussed that testing is beneficial for several reasons including knowing about cancer risks, identifying potential screening and risk-reduction options that may be appropriate, and to understand if other family members could be at risk for cancer and allow them to undergo genetic testing.   We reviewed the characteristics, features and inheritance patterns of hereditary cancer syndromes. We also discussed genetic testing, including the appropriate family members to test, the process of testing, insurance coverage and turn-around-time for results. We discussed the implications of a negative, positive and/or variant of uncertain significant result. We recommended Karina Wise pursue genetic testing for the [facility] Multi-Cancer+RNA gene panel.   Based on Karina Wise's personal history of cancer and Ashkenazi Jewish ancestry,  she meets medical criteria for genetic testing. Despite that she meets criteria, she may still have an out of pocket cost. We discussed that if her out of pocket cost for testing is over $100, the laboratory will call and confirm whether she wants to proceed with testing.  If the out of pocket cost of testing is less than $100 she will be billed by the genetic testing laboratory.   PLAN: After considering the risks, benefits, and limitations, Karina Wise provided informed consent to pursue genetic testing and the blood sample was sent to [facility] for analysis of the Multi-Cancer+RNA  panel. Results should be available within approximately 2-3 weeks' time, at which point they will be disclosed by telephone to Karina Wise, as will any additional recommendations warranted by these results. Karina Wise will receive a summary of her genetic counseling visit and a copy of her results once available. This information will also be available in Epic.   Karina Wise questions were answered to her satisfaction today. Our contact information was provided should additional questions or concerns arise. Thank you for the referral and allowing [place] to share in the care of your patient.   Faith Rogue, MS, [facility] Genetic Counselor [email or web address] Phone: [phone]  The patient was seen for a total of 30 minutes in face-to-face genetic counseling. Patient's husband was present as well.  Dr. Grayland Ormond was available for discussion regarding this case.   _______________________________________________________________________ For Office Staff:  Number of people involved in session: 3 Was an Intern/ student involved with case: yes; [employer] intern Marcello Moores was present and assisted with this case.

## 2022-09-28 ENCOUNTER — Encounter: Payer: Self-pay | Admitting: Rehabilitation

## 2022-09-28 ENCOUNTER — Ambulatory Visit: Payer: Medicare HMO | Attending: General Surgery | Admitting: Rehabilitation

## 2022-09-28 DIAGNOSIS — C50212 Malignant neoplasm of upper-inner quadrant of left female breast: Secondary | ICD-10-CM | POA: Insufficient documentation

## 2022-09-28 DIAGNOSIS — Z170 Estrogen receptor positive status [ER+]: Secondary | ICD-10-CM | POA: Insufficient documentation

## 2022-09-28 DIAGNOSIS — R293 Abnormal posture: Secondary | ICD-10-CM | POA: Insufficient documentation

## 2022-09-28 DIAGNOSIS — Z483 Aftercare following surgery for neoplasm: Secondary | ICD-10-CM | POA: Diagnosis not present

## 2022-09-28 DIAGNOSIS — Z9189 Other specified personal risk factors, not elsewhere classified: Secondary | ICD-10-CM | POA: Insufficient documentation

## 2022-09-28 NOTE — Therapy (Signed)
OUTPATIENT PHYSICAL THERAPY BREAST CANCER POST OP FOLLOW UP   Patient Name: Karina Wise MRN: [id number] DOB:1955-09-14, 67 y.o., female Today's Date: 09/28/2022  END OF SESSION:  PT End of Session - 09/28/22 0954     Visit Number 2    Number of Visits 2    Date for PT Re-Evaluation 09/28/22    PT Start Time 1000    PT Stop Time 1030    PT Time Calculation (min) 30 min    Activity Tolerance Patient tolerated treatment well    Behavior During Therapy [facility] for tasks assessed/performed             Past Medical History:  Diagnosis Date   Breast cancer ([place]) 08/05/2022   HPV (human papilloma virus) anogenital infection    Past Surgical History:  Procedure Laterality Date   APPENDECTOMY  1965   BREAST BIOPSY Left 2020   benign; [facility]   BREAST BIOPSY Left 08/05/2022   [place] LT BREAST BX W LOC DEV 1ST LESION IMG BX SPEC US GUIDE 08/05/2022 [facility]   BREAST BIOPSY  09/08/2022   MM LT RADIOACTIVE SEED LOC MAMMO GUIDE 09/08/2022 [facility]   BREAST LUMPECTOMY WITH RADIOACTIVE SEED AND SENTINEL LYMPH NODE BIOPSY Left 09/09/2022   Procedure: LEFT BREAST LUMPECTOMY WITH RADIOACTIVE SEED AND SENTINEL LYMPH NODE BIOPSY;  Surgeon: Jovita Kussmaul, MD;  Location: [place];  Service: General;  Laterality: Left;   Patient Active Problem List   Diagnosis Date Noted   Malignant neoplasm of upper-inner quadrant of left breast in female, estrogen receptor positive ([place]) 08/19/2022    PCP: Haywood Pao, MD   REFERRING PROVIDER: Jovita Kussmaul, MD   REFERRING DIAG:  Diagnosis  C50.212,Z17.0 (ICD-10-CM) - Malignant neoplasm of upper-inner quadrant of left breast in female, estrogen receptor positive ([place])   THERAPY DIAG:  Malignant neoplasm of upper-inner quadrant of left breast in female, estrogen receptor positive ([place])  Abnormal posture  Aftercare following surgery for neoplasm  At risk for lymphedema  Rationale for Evaluation and Treatment:  Rehabilitation  ONSET DATE: 08/05/22  SUBJECTIVE:                                                                                                                                                                                           SUBJECTIVE STATEMENT: I am feeling well.  Some swelling in the armpit.    PERTINENT HISTORY:  Left lumpectomy and SLNB on 09/09/22. 4 negative nodes removed.  Will be having radiation Patient was diagnosed on 08/05/22 with left grade 2 invasive papillary carcinoma. It measures 1.6 cm and is located  in the upper inner quadrant. It is ER/PR positive with a Ki67 of 5%.     PATIENT GOALS:  Reassess how my recovery is going related to arm function, pain, and swelling.  PAIN:  Are you having pain?   PRECAUTIONS: Recent Surgery, left UE Lymphedema risk  ACTIVITY LEVEL / LEISURE: Back to normal    OBJECTIVE:   PATIENT SURVEYS:  QUICK DASH: 11%   OBSERVATIONS: Wearing compression bra, larger breast lumpectomy incision with some glue present  Seroma vs scar tissue golf ball sized lateral incision No cording  POSTURE:  Rounded shoulders   LYMPHEDEMA ASSESSMENT:   UPPER EXTREMITY AROM/PROM:     A/PROM LEFT   eval 09/28/22  Shoulder extension 70 70  Shoulder flexion 150 150  Shoulder abduction 150 135 - pull in the breast   Shoulder internal rotation     Shoulder external rotation 90 90                          (Blank rows = not tested)   CERVICAL AROM: All within normal limits:    UPPER EXTREMITY STRENGTH: 4+/5    LYMPHEDEMA ASSESSMENTS:    LANDMARK RIGHT   eval  10 cm proximal to olecranon process 26  Olecranon process 23.4  10 cm proximal to ulnar styloid process 20.2  Just proximal to ulnar styloid process 16.1  Across hand at thumb web space 19.3  At base of 2nd digit 6.6  (Blank rows = not tested)   LANDMARK LEFT   eval 09/28/22  10 cm proximal to olecranon process 26 26  Olecranon process 23.5 23.5  10 cm proximal to ulnar  styloid process 20.2 20.2  Just proximal to ulnar styloid process 16.1 16  Across hand at thumb web space 19.4 19.5  At base of 2nd digit 6.6 6.5  (Blank rows = not tested)  PATIENT EDUCATION:  Education details: post op per below Person educated: Patient Education method: Consulting civil engineer, Demonstration, Corporate treasurer cues, Verbal cues, and Handouts Education comprehension: verbalized understanding and returned demonstration  HOME EXERCISE PROGRAM: Reviewed previously given post op HEP. Switched to supine chest stretch and dowel, not noted she had not been doing the wall walk into abduction but had been doing a lateral raise with 3#.  Discussed the difference btw stretch and using weights  ASSESSMENT:  CLINICAL IMPRESSION: Pt is doing very well post lumpectomy.  She has some scar tissue/seroma under the lateral incision and decreased AROM into abduction but it turned out she was using a 3# weight to do a lateral raise vs stretching.  She is set up for SOZO surveillance.    Pt will benefit from skilled therapeutic intervention to improve on the following deficits: Decreased knowledge of precautions, impaired UE functional use, pain, decreased ROM, postural dysfunction.   PT treatment/interventions: ADL/Self care home management, SOZO screening    GOALS: Goals reviewed with patient? Yes  LONG TERM GOALS:  (STG=LTG)  GOALS Name Target Date  Goal status  1 Pt will demonstrate she has regained full shoulder ROM and function post operatively compared to baselines.  Baseline: 09/28/22 MET                    PLAN:  PT FREQUENCY/DURATION: SOZO every 3 months until at least 09/09/24 and every 6 months until at least 09/09/25  PLAN FOR NEXT SESSION: [facility]  [address], [address]  [place] [address]  [phone]  After Breast Cancer Class It is recommended you attend the ABC class to be educated on lymphedema risk reduction. This class is free of  charge and lasts for 1 hour. It is a 1-time class. You will need to download the Webex app either on your phone or computer. We will send you a link the night before or the morning of the class. You should be able to click on that link to join the class. This is not a confidential class. You don't have to turn your camera on, but other participants may be able to see your email address.  Scar massage You can begin gentle scar massage to you incision sites. Gently place one hand on the incision and move the skin (without sliding on the skin) in various directions. Do this for a few minutes and then you can gently massage either coconut oil or vitamin E cream into the scars.  Compression garment You should continue wearing your compression bra until you feel like you no longer have swelling.  Home exercise Program Continue doing the exercises you were given until you feel like you can do them without feeling any tightness at the end.   Walking Program Studies show that 30 minutes of walking per day (fast enough to elevate your heart rate) can significantly reduce the risk of a cancer recurrence. If you can't walk due to other medical reasons, we encourage you to find another activity you could do (like a stationary bike or water exercise).  Posture After breast cancer surgery, people frequently sit with rounded shoulders posture because it puts their incisions on slack and feels better. If you sit like this and scar tissue forms in that position, you can become very tight and have pain sitting or standing with good posture. Try to be aware of your posture and sit and stand up tall to heal properly.  Follow up PT: It is recommended you return every 3 months for the first 3 years following surgery to be assessed on the SOZO machine for an L-Dex score. This helps prevent clinically significant lymphedema in 95% of patients. These follow up screens are 10 minute appointments that you are not billed  for.  Stark Bray, PT 09/28/2022, 10:52 AM

## 2022-09-30 ENCOUNTER — Encounter: Payer: Self-pay | Admitting: *Deleted

## 2022-10-02 DIAGNOSIS — C50212 Malignant neoplasm of upper-inner quadrant of left female breast: Secondary | ICD-10-CM | POA: Diagnosis not present

## 2022-10-02 DIAGNOSIS — Z170 Estrogen receptor positive status [ER+]: Secondary | ICD-10-CM | POA: Diagnosis not present

## 2022-10-07 DIAGNOSIS — C50912 Malignant neoplasm of unspecified site of left female breast: Secondary | ICD-10-CM | POA: Diagnosis not present

## 2022-10-08 ENCOUNTER — Encounter: Payer: Self-pay | Admitting: *Deleted

## 2022-10-08 ENCOUNTER — Telehealth: Payer: Self-pay | Admitting: *Deleted

## 2022-10-08 DIAGNOSIS — C50212 Malignant neoplasm of upper-inner quadrant of left female breast: Secondary | ICD-10-CM

## 2022-10-08 NOTE — Telephone Encounter (Signed)
Received oncotype results of 2/3%. Spoke with patient and she is aware. Referral placed for Dr.Moody.

## 2022-10-12 NOTE — Progress Notes (Signed)
Radiation Oncology         [phone] ________________________________  Name: Karina Wise        MRN: [id number]  Date of Service: 10/14/2022 DOB: October 31, 1955  [id number], Karina Him, MD  Nicholas Lose, MD     REFERRING PHYSICIAN: Nicholas Lose, MD   DIAGNOSIS: The encounter diagnosis was Malignant neoplasm of upper-inner quadrant of left breast in female, estrogen receptor positive ([place]).   HISTORY OF PRESENT ILLNESS: Karina Wise is a 67 y.o. female with a diagnosis of left breast cancer. The patient was noted to have a screening detected left breast mass by mammography.  Further diagnostic workup in the 11:00 axis showed a 1.6 cm mass in her axilla on the left was negative for adenopathy.  She underwent a biopsy that showed grade 2 invasive papillary carcinoma that was ER/PR positive, HER2 negative with a Ki-67 of 5%.    Since her last visit, the patient underwent a left lumpectomy with sentinel lymph node biopsy on 09/09/2022 with Dr. Marlou Starks.  Final pathology showed a grade 2 invasive papillary carcinoma measuring 1.3 cm with focal low-grade DCIS.  Her margins were negative for carcinoma and DCIS.  5 axillary nodes specimens were submitted and 4 contained lymph node material, all were negative for disease.  Oncotype DX score was ordered and resulted with a score of 2.  No systemic chemotherapy is recommended based on this.  She is seen today to discuss adjuvant radiotherapy.   PREVIOUS RADIATION THERAPY: No   PAST MEDICAL HISTORY:  Past Medical History:  Diagnosis Date   Breast cancer ([place]) 08/05/2022   HPV (human papilloma virus) anogenital infection        PAST SURGICAL HISTORY: Past Surgical History:  Procedure Laterality Date   APPENDECTOMY  1965   BREAST BIOPSY Left 2020   benign; [place]   BREAST BIOPSY Left 08/05/2022   [place] LT BREAST BX W LOC DEV 1ST LESION IMG BX SPEC US GUIDE 08/05/2022 [facility]   BREAST BIOPSY  09/08/2022   MM LT RADIOACTIVE SEED LOC  MAMMO GUIDE 09/08/2022 [facility]   BREAST LUMPECTOMY WITH RADIOACTIVE SEED AND SENTINEL LYMPH NODE BIOPSY Left 09/09/2022   Procedure: LEFT BREAST LUMPECTOMY WITH RADIOACTIVE SEED AND SENTINEL LYMPH NODE BIOPSY;  Surgeon: Jovita Kussmaul, MD;  Location: [place];  Service: General;  Laterality: Left;     FAMILY HISTORY:  Family History  Problem Relation Age of Onset   Lung cancer Mother 90   Diabetes Mother    Hypertension Mother    Liver cancer Mother 82       + poss. breast ca   Leukemia Sister 70       LGL   Lung cancer Cousin 52   Head & neck cancer Cousin 68     SOCIAL HISTORY:  reports that she has never smoked. She has never used smokeless tobacco. She reports current alcohol use of about 1.0 standard drink of alcohol per week. She reports that she does not use drugs.  The patient is married and resides in [place].  She is retired from Pharmacist, hospital.    ALLERGIES: Patient has no known allergies.   MEDICATIONS:  Current Outpatient Medications  Medication Sig Dispense Refill   B Complex Vitamins (VITAMIN B COMPLEX PO) Take by mouth.     cholecalciferol (VITAMIN D3) 25 MCG (1000 UT) tablet Take 1,000 Units by mouth daily.     simvastatin (ZOCOR) 20 MG tablet Take 20 mg by mouth  daily.     No current facility-administered medications for this visit.     REVIEW OF SYSTEMS: On review of systems, the patient reports that she is doing okay but is very anxious about her diagnosis. She's had some firmness in her breast since her biopsy. No other complaints are otherwise verbalized.      PHYSICAL EXAM:  Wt Readings from Last 3 Encounters:  09/17/22 140 lb 12.8 oz (63.9 kg)  09/09/22 142 lb 3.2 oz (64.5 kg)  08/20/22 141 lb 3.2 oz (64 kg)   Temp Readings from Last 3 Encounters:  09/17/22 97.6 F (36.4 C) (Temporal)  09/09/22 97.9 F (36.6 C)  08/20/22 97.7 F (36.5 C)   BP Readings from Last 3 Encounters:  09/17/22 (!) 148/96   09/09/22 130/77  08/20/22 (!) 160/101   Pulse Readings from Last 3 Encounters:  09/17/22 (!) 111  09/09/22 (!) 108  08/20/22 (!) 120    In general this is a well appearing Caucasian female in no acute distress. She's alert and oriented x4 and appropriate throughout the examination. Cardiopulmonary assessment is negative for acute distress and she exhibits normal effort. Bilateral breast exam is deferred.    ECOG = 1  0 - Asymptomatic (Fully active, able to carry on all predisease activities without restriction)  1 - Symptomatic but completely ambulatory (Restricted in physically strenuous activity but ambulatory and able to carry out work of a light or sedentary nature. For example, light housework, office work)  2 - Symptomatic, <50% in bed during the day (Ambulatory and capable of all self care but unable to carry out any work activities. Up and about more than 50% of waking hours)  3 - Symptomatic, >50% in bed, but not bedbound (Capable of only limited self-care, confined to bed or chair 50% or more of waking hours)  4 - Bedbound (Completely disabled. Cannot carry on any self-care. Totally confined to bed or chair)  5 - Death   Eustace Pen MM, Creech RH, Tormey DC, et al. [phone]). "Toxicity and response criteria of the [facility]". [place] Oncol. 5 (6): 649-55    LABORATORY DATA:  No results found for: "WBC", "HGB", "HCT", "MCV", "PLT" No results found for: "NA", "K", "CL", "CO2" No results found for: "ALT", "AST", "GGT", "ALKPHOS", "BILITOT"    RADIOGRAPHY: No results found.     IMPRESSION/PLAN: 1. Stage IA, pT1cN0M0, grade 2, ER/PR positive invasive papillary carcinoma of the left breast. Dr. Lisbeth Renshaw has reviewed her final pathology findings and today I reviewed the nature of early-stage left breast disease. She has done wells since surgery and does not need chemotherapy based on her Oncotype Dx score. Dr. Lisbeth Renshaw would recommend external radiotherapy  to the breast  to reduce risks of local recurrence followed by antiestrogen therapy. We discussed the risks, benefits, short, and long term effects of radiotherapy, as well as the curative intent, and the patient is interested in proceeding. I reviewed  the delivery and logistics of radiotherapy and Dr. Lisbeth Renshaw recommends 4 weeks of radiotherapy to the left breast with deep inspiration breath-hold technique. Written consent is obtained and placed in the chart, a copy was provided to the patient. She will simulate tomorrow. ***   In a visit lasting 60 minutes, greater than 50% of the time was spent face to face reviewing her case, as well as in preparation of, discussing, and coordinating the patient's care.  The above documentation reflects my direct findings during this shared patient visit. Please  see the separate note by Dr. Lisbeth Renshaw on this date for the remainder of the patient's plan of care.    Carola Rhine, [facility]    **Disclaimer: This note was dictated with voice recognition software. Similar sounding words can inadvertently be transcribed and this note may contain transcription errors which may not have been corrected upon publication of note.**

## 2022-10-13 ENCOUNTER — Encounter (HOSPITAL_COMMUNITY): Payer: Self-pay

## 2022-10-14 ENCOUNTER — Ambulatory Visit: Payer: Medicare HMO

## 2022-10-14 ENCOUNTER — Ambulatory Visit
Admission: RE | Admit: 2022-10-14 | Discharge: 2022-10-14 | Disposition: A | Discharge status: Home / Self Care | Payer: Medicare HMO | Source: Ambulatory Visit | Attending: Radiation Oncology | Admitting: Radiation Oncology

## 2022-10-14 ENCOUNTER — Encounter: Payer: Self-pay | Admitting: *Deleted

## 2022-10-14 ENCOUNTER — Ambulatory Visit: Payer: Medicare HMO | Admitting: Radiation Oncology

## 2022-10-14 VITALS — BP 143/96 | HR 103 | Temp 97.7°F | Resp 18 | Ht 65.0 in | Wt 140.4 lb

## 2022-10-14 DIAGNOSIS — Z170 Estrogen receptor positive status [ER+]: Secondary | ICD-10-CM | POA: Diagnosis not present

## 2022-10-14 DIAGNOSIS — Z801 Family history of malignant neoplasm of trachea, bronchus and lung: Secondary | ICD-10-CM | POA: Insufficient documentation

## 2022-10-14 DIAGNOSIS — C50212 Malignant neoplasm of upper-inner quadrant of left female breast: Secondary | ICD-10-CM | POA: Insufficient documentation

## 2022-10-14 DIAGNOSIS — Z79899 Other long term (current) drug therapy: Secondary | ICD-10-CM | POA: Diagnosis not present

## 2022-10-14 DIAGNOSIS — Z800 Family history of malignant neoplasm of digestive organs: Secondary | ICD-10-CM | POA: Diagnosis not present

## 2022-10-14 NOTE — Progress Notes (Signed)
Follow-up-new nursing interview for Malignant neoplasm of upper-inner quadrant of left breast in female, estrogen receptor positive ([place]).  Patient identity verified. Patient reports LT breast discomfort 3/10. No other issues reported at this time.  Meaningful use complete. Postmenopausal  Vitals- BP (!) 143/96 (BP Location: Right Arm, Patient Position: Sitting, Cuff Size: Normal)   Pulse (!) 103   Temp 97.7 F (36.5 C) (Temporal)   Resp 18   Ht 5\' 5"  (075-GRM m)   Wt 140 lb 6.4 oz (63.7 kg)   SpO2 99%   BMI 23.36 kg/m   This concludes the interview.   Leandra Kern, LPN

## 2022-10-15 ENCOUNTER — Telehealth: Payer: Self-pay | Admitting: Hematology and Oncology

## 2022-10-15 ENCOUNTER — Ambulatory Visit: Payer: Medicare HMO | Admitting: Radiation Oncology

## 2022-10-15 NOTE — Telephone Encounter (Signed)
Per 3/21 IB reached out to patient to schedule , patient aware of date and time of appointment.

## 2022-10-16 ENCOUNTER — Telehealth: Payer: Self-pay | Admitting: Licensed Clinical Social Worker

## 2022-10-16 NOTE — Telephone Encounter (Signed)
I contacted Ms. Poss to discuss her genetic testing results. No pathogenic variants were identified in the 70 genes analyzed. Detailed clinic note to follow.   The test report has been scanned into EPIC and is located under the Molecular Pathology section of the Results Review tab.  A portion of the result report is included below for reference.      Karina Rogue, MS, [facility] Genetic Counselor [email or web address] Phone: [phone]

## 2022-10-19 ENCOUNTER — Ambulatory Visit: Payer: Self-pay | Admitting: Licensed Clinical Social Worker

## 2022-10-19 ENCOUNTER — Encounter: Payer: Self-pay | Admitting: Licensed Clinical Social Worker

## 2022-10-19 DIAGNOSIS — Z1379 Encounter for other screening for genetic and chromosomal anomalies: Secondary | ICD-10-CM

## 2022-10-19 DIAGNOSIS — C50212 Malignant neoplasm of upper-inner quadrant of left female breast: Secondary | ICD-10-CM | POA: Diagnosis not present

## 2022-10-19 DIAGNOSIS — Z170 Estrogen receptor positive status [ER+]: Secondary | ICD-10-CM | POA: Diagnosis not present

## 2022-10-19 NOTE — Progress Notes (Signed)
HPI:   Ms. Leventis was previously seen in the [facility] due to a personal and family history of cancer and concerns regarding a hereditary predisposition to cancer. Please refer to our prior cancer genetics clinic note for more information regarding our discussion, assessment and recommendations, at the time. Ms. Teran recent genetic test results were disclosed to her, as were recommendations warranted by these results. These results and recommendations are discussed in more detail below.  CANCER HISTORY:  Oncology History  Malignant neoplasm of upper-inner quadrant of left breast in female, estrogen receptor positive ([place])  08/05/2022 Initial Diagnosis   Screening mammogram detected left breast mass by ultrasound measured 1.6 cm, axilla negative, biopsy revealed grade 2 invasive papillary carcinoma ER 100%, PR 80%, Ki-67 5%, HER2 0 negative   08/20/2022 Cancer Staging   Staging form: Breast, AJCC 8th Edition - Clinical: Stage IA (cT1c, cN0, cM0, G2, ER+, PR+, HER2-) - Signed by Nicholas Lose, MD on 08/20/2022 Histologic grading system: 3 grade system   09/09/2022 Surgery   Left lumpectomy: Grade 2 invasive papillary carcinoma 1.3 cm, low-grade DCIS, margins negative, 0/4 lymph nodes negative, ER 100%, PR 80%, HER2 0, Ki-67 5%    Genetic Testing   Negative genetic testing. No pathogenic variants identified on the [facility] Multi-Cancer+RNA panel. The report date is 10/15/2022.  The Multi-Cancer + RNA Panel offered by [facility] includes sequencing and/or deletion/duplication analysis of the following 70 genes:  AIP*, ALK, APC*, ATM*, AXIN2*, BAP1*, BARD1*, BLM*, BMPR1A*, BRCA1*, BRCA2*, BRIP1*, CDC73*, CDH1*, CDK4, CDKN1B*, CDKN2A, CHEK2*, CTNNA1*, DICER1*, EPCAM, EGFR, FH*, FLCN*, GREM1, HOXB13, KIT, LZTR1, MAX*, MBD4, MEN1*, MET, MITF, MLH1*, MSH2*, MSH3*, MSH6*, MUTYH*, NF1*, NF2*, NTHL1*, PALB2*, PDGFRA, PMS2*, POLD1*, POLE*, POT1*, PRKAR1A*, PTCH1*, PTEN*, RAD51C*,  RAD51D*, RB1*, RET, SDHA*, SDHAF2*, SDHB*, SDHC*, SDHD*, SMAD4*, SMARCA4*, SMARCB1*, SMARCE1*, STK11*, SUFU*, TMEM127*, TP53*, TSC1*, TSC2*, VHL*. RNA analysis is performed for * genes.     FAMILY HISTORY:  We obtained a detailed, 4-generation family history.  Significant diagnoses are listed below: Family History  Problem Relation Age of Onset   Lung cancer Mother 78   Diabetes Mother    Hypertension Mother    Liver cancer Mother 61       + poss. breast ca   Leukemia Sister 34       LGL   Lung cancer Cousin 82   Head & neck cancer Cousin 60   Ms. Vanburen has 1 sister, 68, who was diagnosed with LGL leukemia at 53.    Ms. Lauby mother had lung cancer at 43, liver at 7 and possibly breast cancer as well. Maternal cousin had head and neck cancer in his 94s.    Ms. Gamel father died at 9 of heart disease. Patient had a paternal cousin who had lung cancer at 52. Paternal grandmother had Paget's disease.   Ms. Badua is unaware of previous family history of genetic testing for hereditary cancer risks. There is reported Ashkenazi Jewish ancestry on both sides of the family. There is no known consanguinity.      GENETIC TEST RESULTS:  The [facility] Multi-Cancer+RNA Panel found no pathogenic mutations.   The Multi-Cancer + RNA Panel offered by [facility] includes sequencing and/or deletion/duplication analysis of the following 70 genes:  AIP*, ALK, APC*, ATM*, AXIN2*, BAP1*, BARD1*, BLM*, BMPR1A*, BRCA1*, BRCA2*, BRIP1*, CDC73*, CDH1*, CDK4, CDKN1B*, CDKN2A, CHEK2*, CTNNA1*, DICER1*, EPCAM, EGFR, FH*, FLCN*, GREM1, HOXB13, KIT, LZTR1, MAX*, MBD4, MEN1*, MET, MITF, MLH1*, MSH2*, MSH3*, MSH6*, MUTYH*, NF1*, NF2*, NTHL1*,  PALB2*, PDGFRA, PMS2*, POLD1*, POLE*, POT1*, PRKAR1A*, PTCH1*, PTEN*, RAD51C*, RAD51D*, RB1*, RET, SDHA*, SDHAF2*, SDHB*, SDHC*, SDHD*, SMAD4*, SMARCA4*, SMARCB1*, SMARCE1*, STK11*, SUFU*, TMEM127*, TP53*, TSC1*, TSC2*, VHL*. RNA analysis is performed for * genes.    The test report has been scanned into EPIC and is located under the Molecular Pathology section of the Results Review tab.  A portion of the result report is included below for reference. Genetic testing reported out on 10/15/2022.     Even though a pathogenic variant was not identified, possible explanations for the cancer in the family may include: There may be no hereditary risk for cancer in the family. The cancers in Ms. Godown and/or her family may be sporadic/familial or due to other genetic and environmental factors. There may be a gene mutation in one of these genes that current testing methods cannot detect but that chance is small. There could be another gene that has not yet been discovered, or that we have not yet tested, that is responsible for the cancer diagnoses in the family.  It is also possible there is a hereditary cause for the cancer in the family that Ms. Auth did not inherit.  Therefore, it is important to remain in touch with cancer genetics in the future so that we can continue to offer Ms. Feazell the most up to date genetic testing.   ADDITIONAL GENETIC TESTING:  We discussed with Ms. Chaloux that her genetic testing was fairly extensive.  If there are additional relevant genes identified to increase cancer risk that can be analyzed in the future, we would be happy to discuss and coordinate this testing at that time.    CANCER SCREENING RECOMMENDATIONS:  Ms. Ridenhour test result is considered negative (normal).  This means that we have not identified a hereditary cause for her personal and family history of cancer at this time.   An individual's cancer risk and medical management are not determined by genetic test results alone. Overall cancer risk assessment incorporates additional factors, including personal medical history, family history, and any available genetic information that may result in a personalized plan for cancer prevention and surveillance.  Therefore, it is recommended she continue to follow the cancer management and screening guidelines provided by her oncology and primary healthcare provider.  RECOMMENDATIONS FOR FAMILY MEMBERS:   Since she did not inherit a identifiable mutation in a cancer predisposition gene included on this panel, her children could not have inherited a known mutation from her in one of these genes. Individuals in this family might be at some increased risk of developing cancer, over the general population risk, due to the family history of cancer.  Individuals in the family should notify their providers of the family history of cancer. We recommend women in this family have a yearly mammogram beginning at age 68, or 40 years younger than the earliest onset of cancer, an annual clinical breast exam, and perform monthly breast self-exams.  Family members should have colonoscopies by at age 49, or earlier, as recommended by their providers.  FOLLOW-UP:  Lastly, we discussed with Ms. Gangwer that cancer genetics is a rapidly advancing field and it is possible that new genetic tests will be appropriate for her and/or her family members in the future. We encouraged her to remain in contact with cancer genetics on an annual basis so we can update her personal and family histories and let her know of advances in cancer genetics that may benefit this family.   Our contact number  was provided. Ms. Koff questions were answered to her satisfaction, and she knows she is welcome to call us at anytime with additional questions or concerns.    Faith Rogue, MS, [facility] Genetic Counselor [email or web address] Phone: [phone]

## 2022-10-20 DIAGNOSIS — C50912 Malignant neoplasm of unspecified site of left female breast: Secondary | ICD-10-CM | POA: Diagnosis not present

## 2022-10-28 ENCOUNTER — Other Ambulatory Visit: Payer: Self-pay

## 2022-10-28 ENCOUNTER — Ambulatory Visit
Admission: RE | Admit: 2022-10-28 | Discharge: 2022-10-28 | Disposition: A | Discharge status: Home / Self Care | Payer: Medicare HMO | Source: Ambulatory Visit | Attending: Radiation Oncology | Admitting: Radiation Oncology

## 2022-10-28 DIAGNOSIS — C50212 Malignant neoplasm of upper-inner quadrant of left female breast: Secondary | ICD-10-CM | POA: Diagnosis not present

## 2022-10-28 DIAGNOSIS — Z170 Estrogen receptor positive status [ER+]: Secondary | ICD-10-CM | POA: Diagnosis not present

## 2022-10-28 DIAGNOSIS — Z510 Encounter for antineoplastic radiation therapy: Secondary | ICD-10-CM | POA: Diagnosis not present

## 2022-10-28 LAB — RAD ONC ARIA SESSION SUMMARY
Course Elapsed Days: 0
Plan Fractions Treated to Date: 1
Plan Prescribed Dose Per Fraction: 2.66 Gy
Plan Total Fractions Prescribed: 16
Plan Total Prescribed Dose: 42.56 Gy
Reference Point Dosage Given to Date: 2.66 Gy
Reference Point Session Dosage Given: 2.66 Gy
Session Number: 1

## 2022-10-29 ENCOUNTER — Ambulatory Visit
Admission: RE | Admit: 2022-10-29 | Discharge: 2022-10-29 | Disposition: A | Discharge status: Home / Self Care | Payer: Medicare HMO | Source: Ambulatory Visit | Attending: Radiation Oncology | Admitting: Radiation Oncology

## 2022-10-29 ENCOUNTER — Other Ambulatory Visit: Payer: Self-pay

## 2022-10-29 DIAGNOSIS — Z170 Estrogen receptor positive status [ER+]: Secondary | ICD-10-CM | POA: Diagnosis not present

## 2022-10-29 DIAGNOSIS — Z510 Encounter for antineoplastic radiation therapy: Secondary | ICD-10-CM | POA: Diagnosis not present

## 2022-10-29 DIAGNOSIS — C50212 Malignant neoplasm of upper-inner quadrant of left female breast: Secondary | ICD-10-CM | POA: Diagnosis not present

## 2022-10-29 LAB — RAD ONC ARIA SESSION SUMMARY
Course Elapsed Days: 1
Plan Fractions Treated to Date: 2
Plan Prescribed Dose Per Fraction: 2.66 Gy
Plan Total Fractions Prescribed: 16
Plan Total Prescribed Dose: 42.56 Gy
Reference Point Dosage Given to Date: 5.32 Gy
Reference Point Session Dosage Given: 2.66 Gy
Session Number: 2

## 2022-10-30 ENCOUNTER — Other Ambulatory Visit: Payer: Self-pay

## 2022-10-30 ENCOUNTER — Ambulatory Visit
Admission: RE | Admit: 2022-10-30 | Discharge: 2022-10-30 | Disposition: A | Discharge status: Home / Self Care | Payer: Medicare HMO | Source: Ambulatory Visit | Attending: Radiation Oncology | Admitting: Radiation Oncology

## 2022-10-30 DIAGNOSIS — C50212 Malignant neoplasm of upper-inner quadrant of left female breast: Secondary | ICD-10-CM | POA: Diagnosis not present

## 2022-10-30 DIAGNOSIS — Z510 Encounter for antineoplastic radiation therapy: Secondary | ICD-10-CM | POA: Diagnosis not present

## 2022-10-30 DIAGNOSIS — Z170 Estrogen receptor positive status [ER+]: Secondary | ICD-10-CM | POA: Diagnosis not present

## 2022-10-30 LAB — RAD ONC ARIA SESSION SUMMARY
Course Elapsed Days: 2
Plan Fractions Treated to Date: 3
Plan Prescribed Dose Per Fraction: 2.66 Gy
Plan Total Fractions Prescribed: 16
Plan Total Prescribed Dose: 42.56 Gy
Reference Point Dosage Given to Date: 7.98 Gy
Reference Point Session Dosage Given: 2.66 Gy
Session Number: 3

## 2022-11-02 ENCOUNTER — Other Ambulatory Visit: Payer: Self-pay

## 2022-11-02 ENCOUNTER — Ambulatory Visit
Admission: RE | Admit: 2022-11-02 | Discharge: 2022-11-02 | Disposition: A | Discharge status: Home / Self Care | Payer: Medicare HMO | Source: Ambulatory Visit | Attending: Radiation Oncology | Admitting: Radiation Oncology

## 2022-11-02 DIAGNOSIS — Z170 Estrogen receptor positive status [ER+]: Secondary | ICD-10-CM | POA: Diagnosis not present

## 2022-11-02 DIAGNOSIS — Z510 Encounter for antineoplastic radiation therapy: Secondary | ICD-10-CM | POA: Diagnosis not present

## 2022-11-02 DIAGNOSIS — C50212 Malignant neoplasm of upper-inner quadrant of left female breast: Secondary | ICD-10-CM | POA: Diagnosis not present

## 2022-11-02 LAB — RAD ONC ARIA SESSION SUMMARY
Course Elapsed Days: 5
Plan Fractions Treated to Date: 4
Plan Prescribed Dose Per Fraction: 2.66 Gy
Plan Total Fractions Prescribed: 16
Plan Total Prescribed Dose: 42.56 Gy
Reference Point Dosage Given to Date: 10.64 Gy
Reference Point Session Dosage Given: 2.66 Gy
Session Number: 4

## 2022-11-03 ENCOUNTER — Other Ambulatory Visit: Payer: Self-pay

## 2022-11-03 ENCOUNTER — Ambulatory Visit
Admission: RE | Admit: 2022-11-03 | Discharge: 2022-11-03 | Disposition: A | Discharge status: Home / Self Care | Payer: Medicare HMO | Source: Ambulatory Visit | Attending: Radiation Oncology | Admitting: Radiation Oncology

## 2022-11-03 DIAGNOSIS — Z510 Encounter for antineoplastic radiation therapy: Secondary | ICD-10-CM | POA: Diagnosis not present

## 2022-11-03 DIAGNOSIS — Z170 Estrogen receptor positive status [ER+]: Secondary | ICD-10-CM | POA: Diagnosis not present

## 2022-11-03 DIAGNOSIS — C50212 Malignant neoplasm of upper-inner quadrant of left female breast: Secondary | ICD-10-CM | POA: Diagnosis not present

## 2022-11-03 LAB — RAD ONC ARIA SESSION SUMMARY
Course Elapsed Days: 6
Plan Fractions Treated to Date: 5
Plan Prescribed Dose Per Fraction: 2.66 Gy
Plan Total Fractions Prescribed: 16
Plan Total Prescribed Dose: 42.56 Gy
Reference Point Dosage Given to Date: 13.3 Gy
Reference Point Session Dosage Given: 2.66 Gy
Session Number: 5

## 2022-11-04 ENCOUNTER — Ambulatory Visit
Admission: RE | Admit: 2022-11-04 | Discharge: 2022-11-04 | Disposition: A | Discharge status: Home / Self Care | Payer: Medicare HMO | Source: Ambulatory Visit | Attending: Radiation Oncology | Admitting: Radiation Oncology

## 2022-11-04 ENCOUNTER — Encounter: Payer: Self-pay | Admitting: *Deleted

## 2022-11-04 ENCOUNTER — Other Ambulatory Visit: Payer: Self-pay

## 2022-11-04 DIAGNOSIS — Z170 Estrogen receptor positive status [ER+]: Secondary | ICD-10-CM

## 2022-11-04 DIAGNOSIS — C50212 Malignant neoplasm of upper-inner quadrant of left female breast: Secondary | ICD-10-CM | POA: Diagnosis not present

## 2022-11-04 DIAGNOSIS — Z510 Encounter for antineoplastic radiation therapy: Secondary | ICD-10-CM | POA: Diagnosis not present

## 2022-11-04 LAB — RAD ONC ARIA SESSION SUMMARY
Course Elapsed Days: 7
Plan Fractions Treated to Date: 6
Plan Prescribed Dose Per Fraction: 2.66 Gy
Plan Total Fractions Prescribed: 16
Plan Total Prescribed Dose: 42.56 Gy
Reference Point Dosage Given to Date: 15.96 Gy
Reference Point Session Dosage Given: 2.66 Gy
Session Number: 6

## 2022-11-05 ENCOUNTER — Other Ambulatory Visit: Payer: Self-pay

## 2022-11-05 ENCOUNTER — Ambulatory Visit
Admission: RE | Admit: 2022-11-05 | Discharge: 2022-11-05 | Disposition: A | Discharge status: Home / Self Care | Payer: Medicare HMO | Source: Ambulatory Visit | Attending: Radiation Oncology | Admitting: Radiation Oncology

## 2022-11-05 DIAGNOSIS — Z510 Encounter for antineoplastic radiation therapy: Secondary | ICD-10-CM | POA: Diagnosis not present

## 2022-11-05 DIAGNOSIS — Z170 Estrogen receptor positive status [ER+]: Secondary | ICD-10-CM | POA: Diagnosis not present

## 2022-11-05 DIAGNOSIS — C50212 Malignant neoplasm of upper-inner quadrant of left female breast: Secondary | ICD-10-CM | POA: Diagnosis not present

## 2022-11-05 LAB — RAD ONC ARIA SESSION SUMMARY
Course Elapsed Days: 8
Plan Fractions Treated to Date: 7
Plan Prescribed Dose Per Fraction: 2.66 Gy
Plan Total Fractions Prescribed: 16
Plan Total Prescribed Dose: 42.56 Gy
Reference Point Dosage Given to Date: 18.62 Gy
Reference Point Session Dosage Given: 2.66 Gy
Session Number: 7

## 2022-11-06 ENCOUNTER — Ambulatory Visit
Admission: RE | Admit: 2022-11-06 | Discharge: 2022-11-06 | Disposition: A | Discharge status: Home / Self Care | Payer: Medicare HMO | Source: Ambulatory Visit | Attending: Radiation Oncology | Admitting: Radiation Oncology

## 2022-11-06 ENCOUNTER — Other Ambulatory Visit: Payer: Self-pay

## 2022-11-06 DIAGNOSIS — C50212 Malignant neoplasm of upper-inner quadrant of left female breast: Secondary | ICD-10-CM | POA: Diagnosis not present

## 2022-11-06 DIAGNOSIS — Z510 Encounter for antineoplastic radiation therapy: Secondary | ICD-10-CM | POA: Diagnosis not present

## 2022-11-06 DIAGNOSIS — Z170 Estrogen receptor positive status [ER+]: Secondary | ICD-10-CM | POA: Diagnosis not present

## 2022-11-06 LAB — RAD ONC ARIA SESSION SUMMARY
Course Elapsed Days: 9
Plan Fractions Treated to Date: 8
Plan Prescribed Dose Per Fraction: 2.66 Gy
Plan Total Fractions Prescribed: 16
Plan Total Prescribed Dose: 42.56 Gy
Reference Point Dosage Given to Date: 21.28 Gy
Reference Point Session Dosage Given: 2.66 Gy
Session Number: 8

## 2022-11-07 DIAGNOSIS — C50212 Malignant neoplasm of upper-inner quadrant of left female breast: Secondary | ICD-10-CM | POA: Diagnosis not present

## 2022-11-07 DIAGNOSIS — Z510 Encounter for antineoplastic radiation therapy: Secondary | ICD-10-CM | POA: Diagnosis not present

## 2022-11-07 DIAGNOSIS — Z170 Estrogen receptor positive status [ER+]: Secondary | ICD-10-CM | POA: Diagnosis not present

## 2022-11-09 ENCOUNTER — Other Ambulatory Visit: Payer: Self-pay

## 2022-11-09 ENCOUNTER — Ambulatory Visit
Admission: RE | Admit: 2022-11-09 | Discharge: 2022-11-09 | Disposition: A | Discharge status: Home / Self Care | Payer: Medicare HMO | Source: Ambulatory Visit | Attending: Radiation Oncology | Admitting: Radiation Oncology

## 2022-11-09 DIAGNOSIS — Z170 Estrogen receptor positive status [ER+]: Secondary | ICD-10-CM | POA: Diagnosis not present

## 2022-11-09 DIAGNOSIS — C50212 Malignant neoplasm of upper-inner quadrant of left female breast: Secondary | ICD-10-CM | POA: Diagnosis not present

## 2022-11-09 DIAGNOSIS — Z510 Encounter for antineoplastic radiation therapy: Secondary | ICD-10-CM | POA: Diagnosis not present

## 2022-11-09 LAB — RAD ONC ARIA SESSION SUMMARY
Course Elapsed Days: 12
Plan Fractions Treated to Date: 9
Plan Prescribed Dose Per Fraction: 2.66 Gy
Plan Total Fractions Prescribed: 16
Plan Total Prescribed Dose: 42.56 Gy
Reference Point Dosage Given to Date: 23.94 Gy
Reference Point Session Dosage Given: 2.66 Gy
Session Number: 9

## 2022-11-10 ENCOUNTER — Other Ambulatory Visit: Payer: Self-pay

## 2022-11-10 ENCOUNTER — Ambulatory Visit
Admission: RE | Admit: 2022-11-10 | Discharge: 2022-11-10 | Disposition: A | Discharge status: Home / Self Care | Payer: Medicare HMO | Source: Ambulatory Visit | Attending: Radiation Oncology | Admitting: Radiation Oncology

## 2022-11-10 DIAGNOSIS — Z510 Encounter for antineoplastic radiation therapy: Secondary | ICD-10-CM | POA: Diagnosis not present

## 2022-11-10 DIAGNOSIS — Z170 Estrogen receptor positive status [ER+]: Secondary | ICD-10-CM | POA: Diagnosis not present

## 2022-11-10 DIAGNOSIS — C50212 Malignant neoplasm of upper-inner quadrant of left female breast: Secondary | ICD-10-CM | POA: Diagnosis not present

## 2022-11-10 LAB — RAD ONC ARIA SESSION SUMMARY
Course Elapsed Days: 13
Plan Fractions Treated to Date: 10
Plan Prescribed Dose Per Fraction: 2.66 Gy
Plan Total Fractions Prescribed: 16
Plan Total Prescribed Dose: 42.56 Gy
Reference Point Dosage Given to Date: 26.6 Gy
Reference Point Session Dosage Given: 2.66 Gy
Session Number: 10

## 2022-11-11 ENCOUNTER — Ambulatory Visit
Admission: RE | Admit: 2022-11-11 | Discharge: 2022-11-11 | Disposition: A | Discharge status: Home / Self Care | Payer: Medicare HMO | Source: Ambulatory Visit | Attending: Radiation Oncology | Admitting: Radiation Oncology

## 2022-11-11 ENCOUNTER — Other Ambulatory Visit: Payer: Self-pay

## 2022-11-11 DIAGNOSIS — Z170 Estrogen receptor positive status [ER+]: Secondary | ICD-10-CM | POA: Diagnosis not present

## 2022-11-11 DIAGNOSIS — C50212 Malignant neoplasm of upper-inner quadrant of left female breast: Secondary | ICD-10-CM | POA: Diagnosis not present

## 2022-11-11 DIAGNOSIS — Z510 Encounter for antineoplastic radiation therapy: Secondary | ICD-10-CM | POA: Diagnosis not present

## 2022-11-11 LAB — RAD ONC ARIA SESSION SUMMARY
Course Elapsed Days: 14
Plan Fractions Treated to Date: 11
Plan Prescribed Dose Per Fraction: 2.66 Gy
Plan Total Fractions Prescribed: 16
Plan Total Prescribed Dose: 42.56 Gy
Reference Point Dosage Given to Date: 29.26 Gy
Reference Point Session Dosage Given: 2.66 Gy
Session Number: 11

## 2022-11-12 ENCOUNTER — Other Ambulatory Visit: Payer: Self-pay

## 2022-11-12 ENCOUNTER — Ambulatory Visit
Admission: RE | Admit: 2022-11-12 | Discharge: 2022-11-12 | Disposition: A | Discharge status: Home / Self Care | Payer: Medicare HMO | Source: Ambulatory Visit | Attending: Radiation Oncology | Admitting: Radiation Oncology

## 2022-11-12 DIAGNOSIS — C50212 Malignant neoplasm of upper-inner quadrant of left female breast: Secondary | ICD-10-CM | POA: Diagnosis not present

## 2022-11-12 DIAGNOSIS — Z170 Estrogen receptor positive status [ER+]: Secondary | ICD-10-CM | POA: Diagnosis not present

## 2022-11-12 DIAGNOSIS — Z510 Encounter for antineoplastic radiation therapy: Secondary | ICD-10-CM | POA: Diagnosis not present

## 2022-11-12 LAB — RAD ONC ARIA SESSION SUMMARY
Course Elapsed Days: 15
Plan Fractions Treated to Date: 12
Plan Prescribed Dose Per Fraction: 2.66 Gy
Plan Total Fractions Prescribed: 16
Plan Total Prescribed Dose: 42.56 Gy
Reference Point Dosage Given to Date: 31.92 Gy
Reference Point Session Dosage Given: 2.66 Gy
Session Number: 12

## 2022-11-13 ENCOUNTER — Ambulatory Visit
Admission: RE | Admit: 2022-11-13 | Discharge: 2022-11-13 | Disposition: A | Discharge status: Home / Self Care | Payer: Medicare HMO | Source: Ambulatory Visit | Attending: Radiation Oncology | Admitting: Radiation Oncology

## 2022-11-13 ENCOUNTER — Other Ambulatory Visit: Payer: Self-pay

## 2022-11-13 ENCOUNTER — Ambulatory Visit: Payer: Medicare HMO | Admitting: Radiation Oncology

## 2022-11-13 DIAGNOSIS — Z170 Estrogen receptor positive status [ER+]: Secondary | ICD-10-CM | POA: Diagnosis not present

## 2022-11-13 DIAGNOSIS — C50212 Malignant neoplasm of upper-inner quadrant of left female breast: Secondary | ICD-10-CM

## 2022-11-13 DIAGNOSIS — Z510 Encounter for antineoplastic radiation therapy: Secondary | ICD-10-CM | POA: Diagnosis not present

## 2022-11-13 LAB — RAD ONC ARIA SESSION SUMMARY
Course Elapsed Days: 16
Plan Fractions Treated to Date: 13
Plan Prescribed Dose Per Fraction: 2.66 Gy
Plan Total Fractions Prescribed: 16
Plan Total Prescribed Dose: 42.56 Gy
Reference Point Dosage Given to Date: 34.58 Gy
Reference Point Session Dosage Given: 2.66 Gy
Session Number: 13

## 2022-11-13 MED ORDER — RADIAPLEXRX EX GEL: Freq: Once | CUTANEOUS | Status: AC

## 2022-11-16 ENCOUNTER — Ambulatory Visit
Admission: RE | Admit: 2022-11-16 | Discharge: 2022-11-16 | Disposition: A | Discharge status: Home / Self Care | Payer: Medicare HMO | Source: Ambulatory Visit | Attending: Radiation Oncology | Admitting: Radiation Oncology

## 2022-11-16 ENCOUNTER — Ambulatory Visit: Payer: Medicare HMO

## 2022-11-16 ENCOUNTER — Other Ambulatory Visit: Payer: Self-pay

## 2022-11-16 DIAGNOSIS — Z510 Encounter for antineoplastic radiation therapy: Secondary | ICD-10-CM | POA: Diagnosis not present

## 2022-11-16 DIAGNOSIS — C50212 Malignant neoplasm of upper-inner quadrant of left female breast: Secondary | ICD-10-CM | POA: Diagnosis not present

## 2022-11-16 DIAGNOSIS — Z170 Estrogen receptor positive status [ER+]: Secondary | ICD-10-CM | POA: Diagnosis not present

## 2022-11-16 LAB — RAD ONC ARIA SESSION SUMMARY
Course Elapsed Days: 19
Plan Fractions Treated to Date: 14
Plan Prescribed Dose Per Fraction: 2.66 Gy
Plan Total Fractions Prescribed: 16
Plan Total Prescribed Dose: 42.56 Gy
Reference Point Dosage Given to Date: 37.24 Gy
Reference Point Session Dosage Given: 2.66 Gy
Session Number: 14

## 2022-11-17 ENCOUNTER — Other Ambulatory Visit: Payer: Self-pay

## 2022-11-17 ENCOUNTER — Ambulatory Visit
Admission: RE | Admit: 2022-11-17 | Discharge: 2022-11-17 | Disposition: A | Discharge status: Home / Self Care | Payer: Medicare HMO | Source: Ambulatory Visit | Attending: Radiation Oncology | Admitting: Radiation Oncology

## 2022-11-17 ENCOUNTER — Ambulatory Visit: Payer: Medicare HMO

## 2022-11-17 DIAGNOSIS — Z170 Estrogen receptor positive status [ER+]: Secondary | ICD-10-CM | POA: Diagnosis not present

## 2022-11-17 DIAGNOSIS — Z510 Encounter for antineoplastic radiation therapy: Secondary | ICD-10-CM | POA: Diagnosis not present

## 2022-11-17 DIAGNOSIS — C50212 Malignant neoplasm of upper-inner quadrant of left female breast: Secondary | ICD-10-CM | POA: Diagnosis not present

## 2022-11-17 LAB — RAD ONC ARIA SESSION SUMMARY
Course Elapsed Days: 20
Plan Fractions Treated to Date: 15
Plan Prescribed Dose Per Fraction: 2.66 Gy
Plan Total Fractions Prescribed: 16
Plan Total Prescribed Dose: 42.56 Gy
Reference Point Dosage Given to Date: 39.9 Gy
Reference Point Session Dosage Given: 2.66 Gy
Session Number: 15

## 2022-11-18 ENCOUNTER — Ambulatory Visit: Payer: Medicare HMO

## 2022-11-19 ENCOUNTER — Ambulatory Visit: Payer: Medicare HMO

## 2022-11-19 ENCOUNTER — Ambulatory Visit
Admission: RE | Admit: 2022-11-19 | Discharge: 2022-11-19 | Disposition: A | Discharge status: Home / Self Care | Payer: Medicare HMO | Source: Ambulatory Visit | Attending: Radiation Oncology | Admitting: Radiation Oncology

## 2022-11-19 ENCOUNTER — Other Ambulatory Visit: Payer: Self-pay

## 2022-11-19 DIAGNOSIS — Z170 Estrogen receptor positive status [ER+]: Secondary | ICD-10-CM | POA: Diagnosis not present

## 2022-11-19 DIAGNOSIS — Z510 Encounter for antineoplastic radiation therapy: Secondary | ICD-10-CM | POA: Diagnosis not present

## 2022-11-19 DIAGNOSIS — C50212 Malignant neoplasm of upper-inner quadrant of left female breast: Secondary | ICD-10-CM | POA: Diagnosis not present

## 2022-11-19 LAB — RAD ONC ARIA SESSION SUMMARY
Course Elapsed Days: 22
Plan Fractions Treated to Date: 16
Plan Prescribed Dose Per Fraction: 2.66 Gy
Plan Total Fractions Prescribed: 16
Plan Total Prescribed Dose: 42.56 Gy
Reference Point Dosage Given to Date: 42.56 Gy
Reference Point Session Dosage Given: 2.66 Gy
Session Number: 16

## 2022-11-20 ENCOUNTER — Ambulatory Visit
Admission: RE | Admit: 2022-11-20 | Discharge: 2022-11-20 | Disposition: A | Discharge status: Home / Self Care | Payer: Medicare HMO | Source: Ambulatory Visit | Attending: Radiation Oncology | Admitting: Radiation Oncology

## 2022-11-20 ENCOUNTER — Other Ambulatory Visit: Payer: Self-pay

## 2022-11-20 ENCOUNTER — Ambulatory Visit: Payer: Medicare HMO

## 2022-11-20 DIAGNOSIS — Z510 Encounter for antineoplastic radiation therapy: Secondary | ICD-10-CM | POA: Diagnosis not present

## 2022-11-20 DIAGNOSIS — Z170 Estrogen receptor positive status [ER+]: Secondary | ICD-10-CM | POA: Diagnosis not present

## 2022-11-20 DIAGNOSIS — C50212 Malignant neoplasm of upper-inner quadrant of left female breast: Secondary | ICD-10-CM | POA: Diagnosis not present

## 2022-11-20 LAB — RAD ONC ARIA SESSION SUMMARY
Course Elapsed Days: 23
Plan Fractions Treated to Date: 1
Plan Prescribed Dose Per Fraction: 2 Gy
Plan Total Fractions Prescribed: 4
Plan Total Prescribed Dose: 8 Gy
Reference Point Dosage Given to Date: 2 Gy
Reference Point Session Dosage Given: 2 Gy
Session Number: 17

## 2022-11-23 ENCOUNTER — Other Ambulatory Visit: Payer: Self-pay

## 2022-11-23 ENCOUNTER — Ambulatory Visit: Payer: Medicare HMO

## 2022-11-23 ENCOUNTER — Ambulatory Visit
Admission: RE | Admit: 2022-11-23 | Discharge: 2022-11-23 | Disposition: A | Discharge status: Home / Self Care | Payer: Medicare HMO | Source: Ambulatory Visit | Attending: Radiation Oncology | Admitting: Radiation Oncology

## 2022-11-23 DIAGNOSIS — C50212 Malignant neoplasm of upper-inner quadrant of left female breast: Secondary | ICD-10-CM | POA: Diagnosis not present

## 2022-11-23 DIAGNOSIS — Z170 Estrogen receptor positive status [ER+]: Secondary | ICD-10-CM | POA: Diagnosis not present

## 2022-11-23 LAB — RAD ONC ARIA SESSION SUMMARY
Course Elapsed Days: 26
Plan Fractions Treated to Date: 2
Plan Prescribed Dose Per Fraction: 2 Gy
Plan Total Fractions Prescribed: 4
Plan Total Prescribed Dose: 8 Gy
Reference Point Dosage Given to Date: 4 Gy
Reference Point Session Dosage Given: 2 Gy
Session Number: 18

## 2022-11-24 ENCOUNTER — Inpatient Hospital Stay: Payer: Medicare HMO | Attending: Hematology and Oncology | Admitting: Hematology and Oncology

## 2022-11-24 ENCOUNTER — Ambulatory Visit: Payer: Medicare HMO

## 2022-11-24 ENCOUNTER — Other Ambulatory Visit: Payer: Self-pay

## 2022-11-24 ENCOUNTER — Ambulatory Visit
Admission: RE | Admit: 2022-11-24 | Discharge: 2022-11-24 | Disposition: A | Discharge status: Home / Self Care | Payer: Medicare HMO | Source: Ambulatory Visit | Attending: Radiation Oncology | Admitting: Radiation Oncology

## 2022-11-24 VITALS — BP 141/90 | HR 105 | Temp 97.3°F | Resp 18 | Ht 65.0 in | Wt 138.8 lb

## 2022-11-24 DIAGNOSIS — C50212 Malignant neoplasm of upper-inner quadrant of left female breast: Secondary | ICD-10-CM

## 2022-11-24 DIAGNOSIS — Z170 Estrogen receptor positive status [ER+]: Secondary | ICD-10-CM | POA: Insufficient documentation

## 2022-11-24 LAB — RAD ONC ARIA SESSION SUMMARY
Course Elapsed Days: 27
Plan Fractions Treated to Date: 3
Plan Prescribed Dose Per Fraction: 2 Gy
Plan Total Fractions Prescribed: 4
Plan Total Prescribed Dose: 8 Gy
Reference Point Dosage Given to Date: 6 Gy
Reference Point Session Dosage Given: 2 Gy
Session Number: 19

## 2022-11-24 MED ORDER — ANASTROZOLE 1 MG PO TABS: 1.0000 mg | ORAL_TABLET | Freq: Every day | ORAL | 3 refills | Status: DC

## 2022-11-24 NOTE — Progress Notes (Signed)
Patient Care Team: Tisovec, Adelfa Koh, MD as PCP - General (Internal Medicine) Pershing Proud, RN as Oncology Nurse Navigator Donnelly Angelica, RN as Oncology Nurse Navigator [name], MD as Consulting Physician (Hematology and Oncology)  DIAGNOSIS:  Encounter Diagnosis  Name Primary?   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 No pathogenic variants identified on the [facility] Multi-Cancer+RNA panel. The report date is 10/15/2022.  The Multi-Cancer + RNA Panel offered by [facility] includes sequencing and/or deletion/duplication analysis of the following 70 genes:  AIP*, ALK, APC*, ATM*, AXIN2*, BAP1*, BARD1*, BLM*, BMPR1A*, BRCA1*, BRCA2*, BRIP1*, CDC73*, CDH1*, CDK4, CDKN1B*, CDKN2A, CHEK2*, CTNNA1*, DICER1*, EPCAM, EGFR, FH*, FLCN*, GREM1, HOXB13, KIT, LZTR1, MAX*, MBD4, MEN1*, MET, MITF, MLH1*, MSH2*, MSH3*, MSH6*, MUTYH*, NF1*, NF2*, NTHL1*, PALB2*, PDGFRA, PMS2*, POLD1*, POLE*, POT1*, PRKAR1A*, PTCH1*, PTEN*, RAD51C*, RAD51D*, RB1*, RET, SDHA*, SDHAF2*, SDHB*, SDHC*, SDHD*, SMAD4*, SMARCA4*, SMARCB1*, SMARCE1*, STK11*,  SUFU*, TMEM127*, TP53*, TSC1*, TSC2*, VHL*. RNA analysis is performed for * genes.   10/29/2022 - 11/25/2022 Radiation Therapy   Adjuvant radiation therapy     CHIEF COMPLIANT: Follow-up after radiation  INTERVAL HISTORY: Karina Wise is a 67 y.o. female is here because of recent diagnosis of left breast cancer.  She presents to the clinic for a follow-up to discuss adjuvant antiestrogen therapy with anastrozole 1 mg daily x 5 years. She reports radiation went well. She had no side effects or complaints.   ALLERGIES:  has No Known Allergies.  MEDICATIONS:  Current Outpatient Medications  Medication Sig Dispense Refill   anastrozole (ARIMIDEX) 1 MG tablet Take 1 tablet (1 mg total) by mouth daily. 90 tablet 3   B Complex Vitamins (VITAMIN B COMPLEX PO) Take by mouth.     cholecalciferol (VITAMIN D3) 25 MCG (1000 UT) tablet Take 1,000 Units by mouth daily.     simvastatin (ZOCOR) 20 MG tablet Take 20 mg by mouth daily.     No current facility-administered medications for this visit.    PHYSICAL EXAMINATION: ECOG PERFORMANCE STATUS: 1 - Symptomatic but completely ambulatory  Vitals:   11/24/22 1004  BP: (!) 141/90  Pulse: (!) 105  Resp: 18  Temp: (!) 97.3 F (36.3 C)  SpO2: 99%   Filed Weights   11/24/22 1004  Weight: 138 lb 12.8 oz (63 kg)    ASSESSMENT & PLAN:  Malignant neoplasm of upper-inner quadrant of left breast in female, estrogen receptor positive (HCC) 09/09/2022:Left lumpectomy: Grade 2 invasive papillary carcinoma 1.3 cm, low-grade DCIS, margins negative, 0/4 lymph nodes negative, ER 100%, PR 80%, HER2 0, Ki-67 5% Oncotype DX recurrence score: 2 (risk of distant recurrence: 3%)  Adjuvant radiation:  10/29/2022-11/25/2022  Treatment plan: Adjuvant antiestrogen therapy with anastrozole 1 mg daily x 5 years to start 12/08/2022   Anastrozole counseling: We discussed the risks and benefits of anti-estrogen therapy with aromatase inhibitors. These include but not  limited to insomnia, hot flashes, mood changes, vaginal dryness, bone density loss, and weight gain. We strongly believe that the benefits far outweigh the risks. Patient understands these risks and consented to starting treatment. Planned treatment duration is 5 years.  Patient enjoys playing golf. Return to clinic in 3 months for survivorship care plan visit    No orders of the defined types were placed in this encounter.  The patient has a good understanding of the overall plan. she agrees with it. she will call with any problems that may develop before the next visit here. Total time spent: 30 mins including face to face time and time spent for planning, charting and co-ordination of care   Tamsen Meek, MD 11/24/22

## 2022-11-24 NOTE — Assessment & Plan Note (Signed)
09/09/2022:Left lumpectomy: Grade 2 invasive papillary carcinoma 1.3 cm, low-grade DCIS, margins negative, 0/4 lymph nodes negative, ER 100%, PR 80%, HER2 0, Ki-67 5% Oncotype DX recurrence score: 2 (risk of distant recurrence: 3%)  Adjuvant radiation: 10/29/2022-11/25/2022  Treatment plan: Adjuvant antiestrogen therapy with anastrozole 1 mg daily x 5 years   Anastrozole counseling: We discussed the risks and benefits of anti-estrogen therapy with aromatase inhibitors. These include but not limited to insomnia, hot flashes, mood changes, vaginal dryness, bone density loss, and weight gain. We strongly believe that the benefits far outweigh the risks. Patient understands these risks and consented to starting treatment. Planned treatment duration is 5 years.  Return to clinic in 3 months for survivorship care plan visit

## 2022-11-25 ENCOUNTER — Other Ambulatory Visit: Payer: Self-pay

## 2022-11-25 ENCOUNTER — Ambulatory Visit
Admission: RE | Admit: 2022-11-25 | Discharge: 2022-11-25 | Disposition: A | Discharge status: Home / Self Care | Payer: Medicare HMO | Source: Ambulatory Visit | Attending: Radiation Oncology | Admitting: Radiation Oncology

## 2022-11-25 DIAGNOSIS — Z170 Estrogen receptor positive status [ER+]: Secondary | ICD-10-CM | POA: Insufficient documentation

## 2022-11-25 DIAGNOSIS — Z510 Encounter for antineoplastic radiation therapy: Secondary | ICD-10-CM | POA: Diagnosis not present

## 2022-11-25 DIAGNOSIS — C50212 Malignant neoplasm of upper-inner quadrant of left female breast: Secondary | ICD-10-CM | POA: Diagnosis not present

## 2022-11-25 LAB — RAD ONC ARIA SESSION SUMMARY
Course Elapsed Days: 28
Plan Fractions Treated to Date: 4
Plan Prescribed Dose Per Fraction: 2 Gy
Plan Total Fractions Prescribed: 4
Plan Total Prescribed Dose: 8 Gy
Reference Point Dosage Given to Date: 8 Gy
Reference Point Session Dosage Given: 2 Gy
Session Number: 20

## 2022-11-26 ENCOUNTER — Telehealth: Payer: Self-pay | Admitting: Hematology and Oncology

## 2022-11-26 NOTE — Telephone Encounter (Signed)
Scheduled appointment per 4/30 los. Patient is aware of the made appointment.

## 2022-11-30 NOTE — Radiation Completion Notes (Addendum)
  Radiation Oncology         [phone]) [phone] ________________________________  Name: Karina Wise MRN: [id number]  Date of Service: 11/25/2022  DOB: October 02, 1955  End of Treatment Note    Diagnosis: Stage IA, pT1cN0M0, grade 2, ER/PR positive invasive papillary carcinoma of the left breast  Intent: Curative     ==========DELIVERED PLANS==========  First Treatment Date: 2022-10-28 - Last Treatment Date: 2022-11-25   Plan Name: Breast_L_BH Site: Breast, Left Technique: 3D Mode: Photon Dose Per Fraction: 2.66 Gy Prescribed Dose (Delivered / Prescribed): 42.56 Gy / 42.56 Gy Prescribed Fxs (Delivered / Prescribed): 16 / 16   Plan Name: Brst_L_Bst_BH Site: Breast, Left Technique: 3D Mode: Photon Dose Per Fraction: 2 Gy Prescribed Dose (Delivered / Prescribed): 8 Gy / 8 Gy Prescribed Fxs (Delivered / Prescribed): 4 / 4     ==========ON TREATMENT VISIT DATES========== 2022-10-30, 2022-11-06, 2022-11-13, 2022-11-20  See weekly On Treatment Notes is Epic for details. The patient tolerated radiation. She developed fatigue and anticipated skin changes in the treatment field.   The patient will receive a call in about one month from the radiation oncology department. She will continue follow up with Dr. Pamelia Hoit as well.      Osker Mason, PAC

## 2022-12-14 ENCOUNTER — Ambulatory Visit: Payer: Medicare HMO | Attending: General Surgery

## 2022-12-14 VITALS — Wt 136.5 lb

## 2022-12-14 DIAGNOSIS — Z483 Aftercare following surgery for neoplasm: Secondary | ICD-10-CM

## 2022-12-14 NOTE — Therapy (Signed)
  OUTPATIENT PHYSICAL THERAPY SOZO SCREENING NOTE   Patient Name: Karina Wise MRN: [id number] DOB:[birth year], 67 y.o., female Today's Date: 12/14/2022  PCP: Gaspar Garbe, MD REFERRING PROVIDER: Griselda Miner, MD   PT End of Session - 12/14/22 1002     Visit Number 2   # unchanged due to screen only   PT Start Time 0959    PT Stop Time 1007    PT Time Calculation (min) 8 min    Activity Tolerance Patient tolerated treatment well    Behavior During Therapy [facility] for tasks assessed/performed             Past Medical History:  Diagnosis Date   Breast cancer (HCC) 08/05/2022   HPV (human papilloma virus) anogenital infection    Past Surgical History:  Procedure Laterality Date   APPENDECTOMY  1965   BREAST BIOPSY Left 2020   benign; [facility]   BREAST BIOPSY Left 08/05/2022   [place] LT BREAST BX W LOC DEV 1ST LESION IMG BX SPEC US GUIDE 08/05/2022 [facility]   BREAST BIOPSY  09/08/2022   MM LT RADIOACTIVE SEED LOC MAMMO GUIDE 09/08/2022 [facility]   BREAST LUMPECTOMY WITH RADIOACTIVE SEED AND SENTINEL LYMPH NODE BIOPSY Left 09/09/2022   Procedure: LEFT BREAST LUMPECTOMY WITH RADIOACTIVE SEED AND SENTINEL LYMPH NODE BIOPSY;  Surgeon: Griselda Miner, MD;  Location: [facility];  Service: General;  Laterality: Left;   Patient Active Problem List   Diagnosis Date Noted   Genetic testing 10/19/2022   Malignant neoplasm of upper-inner quadrant of left breast in female, estrogen receptor positive (HCC) 08/19/2022    REFERRING DIAG: left breast cancer at risk for lymphedema  THERAPY DIAG:  Aftercare following surgery for neoplasm  PERTINENT HISTORY: Left lumpectomy and SLNB on 09/09/22. 4 negative nodes removed.  Will be having radiation Patient was diagnosed on 08/05/22 with left grade 2 invasive papillary carcinoma. It measures 1.6 cm and is located in the upper inner quadrant. It is ER/PR positive with a Ki67 of 5%.   PRECAUTIONS: left UE Lymphedema  risk, None  SUBJECTIVE: Pt returns for her first 3 month L-Dex screen.  PAIN:  Are you having pain? No  SOZO SCREENING: Patient was assessed today using the SOZO machine to determine the lymphedema index score. This was compared to her baseline score. It was determined that she is within the recommended range when compared to her baseline and no further action is needed at this time. She will continue SOZO screenings. These are done every 3 months for 2 years post operatively followed by every 6 months for 2 years, and then annually.   L-DEX FLOWSHEETS - 12/14/22 1000       L-DEX LYMPHEDEMA SCREENING   Measurement Type Unilateral    L-DEX MEASUREMENT EXTREMITY Upper Extremity    POSITION  Standing    DOMINANT SIDE Right    At Risk Side Left    BASELINE SCORE (UNILATERAL) 1.5    L-DEX SCORE (UNILATERAL) 3.1    VALUE CHANGE (UNILAT) 1.6               Hermenia Bers, PTA 12/14/2022, 10:08 AM

## 2023-01-26 NOTE — Progress Notes (Signed)
  Radiation Oncology         [phone]) [phone] ________________________________  Name: Karina Wise MRN: [id number]  Date of Service: 02/01/2023  DOB: 29-Apr-1956  Post Treatment Telephone Note  Diagnosis:  Stage IA, pT1cN0M0, grade 2, ER/PR positive invasive papillary carcinoma of the left breast (as documented in provider EOT note)   The patient was available for call today.   Symptoms of fatigue have improved since completing therapy.  Symptoms of skin changes have improved since completing therapy.  The patient was encouraged to avoid sun exposure in the area of prior treatment for up to one year following radiation with either sunscreen or by the style of clothing worn in the sun.  The patient has scheduled follow up with her medical oncologist Dr. Pamelia Hoit for ongoing surveillance, and was encouraged to call if she develops concerns or questions regarding radiation.   This concludes the interaction.  Ruel Favors, LPN

## 2023-02-01 ENCOUNTER — Ambulatory Visit
Admission: RE | Admit: 2023-02-01 | Discharge: 2023-02-01 | Disposition: A | Discharge status: Home / Self Care | Payer: Medicare HMO | Source: Ambulatory Visit | Attending: Radiation Oncology | Admitting: Radiation Oncology

## 2023-02-25 ENCOUNTER — Other Ambulatory Visit: Payer: Self-pay

## 2023-02-25 ENCOUNTER — Inpatient Hospital Stay: Payer: Medicare HMO | Attending: Adult Health | Admitting: Adult Health

## 2023-02-25 ENCOUNTER — Encounter: Payer: Self-pay | Admitting: Adult Health

## 2023-02-25 VITALS — BP 140/95 | HR 114 | Temp 97.9°F | Resp 19 | Ht 65.0 in | Wt 137.4 lb

## 2023-02-25 DIAGNOSIS — Z79811 Long term (current) use of aromatase inhibitors: Secondary | ICD-10-CM | POA: Diagnosis not present

## 2023-02-25 DIAGNOSIS — C50212 Malignant neoplasm of upper-inner quadrant of left female breast: Secondary | ICD-10-CM | POA: Insufficient documentation

## 2023-02-25 DIAGNOSIS — Z923 Personal history of irradiation: Secondary | ICD-10-CM | POA: Diagnosis not present

## 2023-02-25 DIAGNOSIS — Z170 Estrogen receptor positive status [ER+]: Secondary | ICD-10-CM | POA: Diagnosis not present

## 2023-02-25 DIAGNOSIS — N390 Urinary tract infection, site not specified: Secondary | ICD-10-CM | POA: Diagnosis not present

## 2023-02-25 NOTE — Progress Notes (Signed)
SURVIVORSHIP VISIT:  BRIEF ONCOLOGIC HISTORY:  Oncology History  Malignant neoplasm of upper-inner quadrant of left breast in female, estrogen receptor positive (HCC)  08/05/2022 Initial Diagnosis   Screening mammogram detected left breast mass by ultrasound measured 1.6 cm, axilla negative, biopsy revealed grade 2 invasive papillary carcinoma ER 100%, PR 80%, Ki-67 5%, HER2 0 negative   08/20/2022 Cancer Staging   Staging form: Breast, AJCC 8th Edition - Clinical: Stage IA (cT1c, cN0, cM0, G2, ER+, PR+, HER2-) - Signed by [name], MD on 08/20/2022 Histologic grading system: 3 grade system   09/09/2022 Surgery   Left lumpectomy: Grade 2 invasive papillary carcinoma 1.3 cm, low-grade DCIS, margins negative, 0/4 lymph nodes negative, ER 100%, PR 80%, HER2 0, Ki-67 5%    Genetic Testing   Negative genetic testing. No pathogenic variants identified on Karina [facility] Multi-Cancer+RNA panel. Karina report date is 10/15/2022.  Karina Multi-Cancer + RNA Panel offered by [facility] includes sequencing and/or deletion/duplication analysis of Karina following 70 genes:  AIP*, ALK, APC*, ATM*, AXIN2*, BAP1*, BARD1*, BLM*, BMPR1A*, BRCA1*, BRCA2*, BRIP1*, CDC73*, CDH1*, CDK4, CDKN1B*, CDKN2A, CHEK2*, CTNNA1*, DICER1*, EPCAM, EGFR, FH*, FLCN*, GREM1, HOXB13, KIT, LZTR1, MAX*, MBD4, MEN1*, MET, MITF, MLH1*, MSH2*, MSH3*, MSH6*, MUTYH*, NF1*, NF2*, NTHL1*, PALB2*, PDGFRA, PMS2*, POLD1*, POLE*, POT1*, PRKAR1A*, PTCH1*, PTEN*, RAD51C*, RAD51D*, RB1*, RET, SDHA*, SDHAF2*, SDHB*, SDHC*, SDHD*, SMAD4*, SMARCA4*, SMARCB1*, SMARCE1*, STK11*, SUFU*, TMEM127*, TP53*, TSC1*, TSC2*, VHL*. RNA analysis is performed for * genes.   10/29/2022 - 11/25/2022 Radiation Therapy   Adjuvant radiation therapy   11/2022 -  Anti-estrogen oral therapy   Anastrozole daily     INTERVAL HISTORY:  Karina Wise to review her survivorship care plan detailing her treatment course for breast cancer, as well as monitoring long-term side effects of that  treatment, education regarding health maintenance, screening, and overall wellness and health promotion.     Overall, Karina Wise reports feeling quite well.  She is taking anastrozole daily and tolerates it moderately well.  She has mild arthralgias and fatigue however she does not feel these impact her quality of life.  REVIEW OF SYSTEMS:  Review of Systems  Constitutional:  Positive for fatigue. Negative for appetite change, chills, fever and unexpected weight change.  HENT:   Negative for hearing loss, lump/mass and trouble swallowing.   Eyes:  Negative for eye problems and icterus.  Respiratory:  Negative for chest tightness, cough and shortness of breath.   Cardiovascular:  Negative for chest pain, leg swelling and palpitations.  Gastrointestinal:  Negative for abdominal distention, abdominal pain, constipation, diarrhea, nausea and vomiting.  Endocrine: Negative for hot flashes.  Genitourinary:  Negative for difficulty urinating.   Musculoskeletal:  Positive for arthralgias.  Skin:  Negative for itching and rash.  Neurological:  Negative for dizziness, extremity weakness, headaches and numbness.  Hematological:  Negative for adenopathy. Does not bruise/bleed easily.  Psychiatric/Behavioral:  Negative for depression. Karina Wise is not nervous/anxious.    Breast: Denies any new nodularity, masses, tenderness, nipple changes, or nipple discharge.       PAST MEDICAL/SURGICAL HISTORY:  Past Medical History:  Diagnosis Date   Breast cancer (HCC) 08/05/2022   HPV (human papilloma virus) anogenital infection    Past Surgical History:  Procedure Laterality Date   APPENDECTOMY  1965   BREAST BIOPSY Left 2020   benign; [facility]   BREAST BIOPSY Left 08/05/2022   [place] LT BREAST BX W LOC DEV 1ST LESION IMG BX SPEC US GUIDE 08/05/2022 [facility]   BREAST BIOPSY  09/08/2022   MM LT RADIOACTIVE SEED LOC MAMMO GUIDE 09/08/2022 [facility]   BREAST LUMPECTOMY WITH RADIOACTIVE SEED  AND SENTINEL LYMPH NODE BIOPSY Left 09/09/2022   Procedure: LEFT BREAST LUMPECTOMY WITH RADIOACTIVE SEED AND SENTINEL LYMPH NODE BIOPSY;  Surgeon: Griselda Miner, MD;  Location: [facility];  Service: General;  Laterality: Left;     ALLERGIES:  No Known Allergies   CURRENT MEDICATIONS:  Outpatient Encounter Medications as of 02/25/2023  Medication Sig   anastrozole (ARIMIDEX) 1 MG tablet Take 1 tablet (1 mg total) by mouth daily.   B Complex Vitamins (VITAMIN B COMPLEX PO) Take by mouth.   cephALEXin (KEFLEX) 250 MG capsule Take 250 mg by mouth 3 (three) times daily.   cholecalciferol (VITAMIN D3) 25 MCG (1000 UT) tablet Take 1,000 Units by mouth daily.   simvastatin (ZOCOR) 20 MG tablet Take 20 mg by mouth daily.   No facility-administered encounter medications on file as of 02/25/2023.     ONCOLOGIC FAMILY HISTORY:  Family History  Problem Relation Age of Onset   Lung cancer Mother 65   Diabetes Mother    Hypertension Mother    Liver cancer Mother 37       + poss. breast ca   Leukemia Sister 48       LGL   Lung cancer Cousin 55   Head & neck cancer Cousin 31     SOCIAL HISTORY:  Social History   Socioeconomic History   Marital status: Married    Spouse name: Not on file   Number of children: Not on file   Years of education: Not on file   Highest education level: Not on file  Occupational History   Not on file  Tobacco Use   Smoking status: Never   Smokeless tobacco: Never  Vaping Use   Vaping status: Never Used  Substance and Sexual Activity   Alcohol use: Yes    Alcohol/week: 1.0 standard drink of alcohol    Types: 1 Glasses of wine per week    Comment: 1 glass of wine a day    Drug use: Never   Sexual activity: Not Currently    Birth control/protection: Post-menopausal    Comment: 1st intercourse -16, partners- 4  Other Topics Concern   Not on file  Social History Narrative   Not on file   Social Determinants of Health   Financial  Resource Strain: Not on file  Food Insecurity: No Food Insecurity (10/14/2022)   Hunger Vital Sign    Worried About Running Out of Food in Karina Last Year: Never true    Ran Out of Food in Karina Last Year: Never true  Transportation Needs: No Transportation Needs (10/14/2022)   PRAPARE - Administrator, Civil Service (Medical): No    Lack of Transportation (Non-Medical): No  Physical Activity: Not on file  Stress: Not on file  Social Connections: Not on file  Intimate Partner Violence: Not At Risk (10/14/2022)   Humiliation, Afraid, Rape, and Kick questionnaire    Fear of Current or Ex-Partner: No    Emotionally Abused: No    Physically Abused: No    Sexually Abused: No     OBSERVATIONS/OBJECTIVE:  BP (!) 140/95   Pulse (!) 114   Temp 97.9 F (36.6 C) (Temporal)   Resp 19   Ht 5\' 5"  (1.651 m)   Wt 137 lb 6.4 oz (62.3 kg)   SpO2 96%   BMI 22.86 kg/m  GENERAL: Wise is a well appearing female in no acute distress HEENT:  Sclerae anicteric.  Oropharynx clear and moist. No ulcerations or evidence of oropharyngeal candidiasis. Neck is supple.  NODES:  No cervical, supraclavicular, or axillary lymphadenopathy palpated.  BREAST EXAM: Left breast status postlumpectomy and radiation no sign of local recurrence right breast is benign. LUNGS:  Clear to auscultation bilaterally.  No wheezes or rhonchi. HEART:  Regular rate and rhythm. No murmur appreciated. ABDOMEN:  Soft, nontender.  Positive, normoactive bowel sounds. No organomegaly palpated. MSK:  No focal spinal tenderness to palpation. Full range of motion bilaterally in Karina upper extremities. EXTREMITIES:  No peripheral edema.   SKIN:  Clear with no obvious rashes or skin changes. No nail dyscrasia. NEURO:  Nonfocal. Well oriented.  Appropriate affect.   LABORATORY DATA:  None for this visit.  DIAGNOSTIC IMAGING:  None for this visit.      ASSESSMENT AND PLAN:  Ms.. Ala is a pleasant 67 y.o. female with  Stage IA left breast invasive papillary carcinoma, ER+/PR+/HER2-, diagnosed in 07/2022, treated with lumpectomy, adjuvant radiation therapy, and anti-estrogen therapy with Anastrozole beginning in 11/2022.  [facility] for our initial meeting and routine follow-up post-completion of treatment for breast cancer.    1. Stage IA left breast cancer:  Karina Wise is continuing to recover from definitive treatment for breast cancer. She will follow-up with her medical oncologist, Dr. Pamelia Hoit in 08/2023 with history and physical exam per surveillance protocol.  She will continue her anti-estrogen therapy with Anastrozole. Thus far, she is tolerating Karina Anastrozole well, with minimal side effects. Her mammogram is due 06/2023; orders placed today.   Today, a comprehensive survivorship care plan and treatment summary was reviewed with Karina Wise today detailing her breast cancer diagnosis, treatment course, potential late/long-term effects of treatment, appropriate follow-up care with recommendations for Karina future, and Wise education resources.  A copy of this summary, along with a letter will be sent to Karina Wise's primary care provider via mail/fax/In Basket message after today's visit.    2. Bone health:  Given Karina Wise's age/history of breast cancer and her current treatment regimen including anti-estrogen therapy with Anastrozole, she is at risk for bone demineralization.  She underwent bone density testing in 11/2019 that demonstrated mild osteopenia with a t score of -1.3.  She was given education on specific activities to promote bone health.  3. Cancer screening:  Due to Karina Wise's history and her age, she should receive screening for skin cancers, colon cancer, and gynecologic cancers.  Karina information and recommendations are listed on Karina Wise's comprehensive care plan/treatment summary and were reviewed in detail with Karina Wise.    4. Health maintenance and  wellness promotion: Karina Wise was encouraged to consume 5-7 servings of fruits and vegetables per day. We reviewed Karina "Nutrition Rainbow" handout.  She was also encouraged to engage in moderate to vigorous exercise for 30 minutes per day most days of Karina week.  She was instructed to limit her alcohol consumption and continue to abstain from tobacco use.     5. Support services/counseling: It is not uncommon for this period of Karina Wise's cancer care trajectory to be one of many emotions and stressors.   She was given information regarding our available services and encouraged to contact me with any questions or for help enrolling in any of our support group/programs.   6. Urinary Tract Infection: She completed a round of Keflex and is following  with her primary care about this.  She did not have a urinalysis or urine culture completed prior to antibiotics.  She is can follow-up with her primary care.  I let her know that if she has recurrent urinary tract infections to please let us know because sometimes those can be related to vaginal dryness from Karina anastrozole.  She is can keep an eye on this and let us know.   Follow up instructions:    -Return to cancer center as scheduled in February 2025 for follow-up with Dr. Pamelia Hoit -Mammogram due in December 2024 -Follow-up with Dr. Carolynne Edouard and at [facility] surgery 02/2023 -She is welcome to return back to [facility] at any time; no additional follow-up needed at this time.  -Consider referral back to survivorship as a long-term survivor for continued surveillance  Karina Wise was provided an opportunity to ask questions and all were answered. Karina Wise agreed with Karina plan and demonstrated an understanding of Karina instructions.   Total encounter time:40 minutes*in face-to-face visit time, chart review, lab review, care coordination, order entry, and documentation of Karina encounter time.    Lillard Anes, NP 02/25/23 10:54  AM Medical Oncology and Hematology [facility] [address] [place], [place] [address] Tel. [phone]    Fax. [phone]  *Total Encounter Time as defined by Karina [employer] for Medicare and Medicaid Services includes, in addition to Karina face-to-face time of a Wise visit (documented in Karina note above) non-face-to-face time: obtaining and reviewing outside history, ordering and reviewing medications, tests or procedures, care coordination (communications with other health care professionals or caregivers) and documentation in Karina medical record.

## 2023-03-17 DIAGNOSIS — H524 Presbyopia: Secondary | ICD-10-CM | POA: Diagnosis not present

## 2023-03-17 DIAGNOSIS — H5203 Hypermetropia, bilateral: Secondary | ICD-10-CM | POA: Diagnosis not present

## 2023-03-17 DIAGNOSIS — H25013 Cortical age-related cataract, bilateral: Secondary | ICD-10-CM | POA: Diagnosis not present

## 2023-03-17 DIAGNOSIS — H52203 Unspecified astigmatism, bilateral: Secondary | ICD-10-CM | POA: Diagnosis not present

## 2023-03-17 DIAGNOSIS — H2513 Age-related nuclear cataract, bilateral: Secondary | ICD-10-CM | POA: Diagnosis not present

## 2023-03-17 DIAGNOSIS — H43813 Vitreous degeneration, bilateral: Secondary | ICD-10-CM | POA: Diagnosis not present

## 2023-03-23 DIAGNOSIS — Z170 Estrogen receptor positive status [ER+]: Secondary | ICD-10-CM | POA: Diagnosis not present

## 2023-03-23 DIAGNOSIS — C50212 Malignant neoplasm of upper-inner quadrant of left female breast: Secondary | ICD-10-CM | POA: Diagnosis not present

## 2023-04-05 ENCOUNTER — Ambulatory Visit: Payer: Medicare HMO | Attending: General Surgery

## 2023-04-05 VITALS — Wt 140.4 lb

## 2023-04-05 DIAGNOSIS — Z483 Aftercare following surgery for neoplasm: Secondary | ICD-10-CM | POA: Insufficient documentation

## 2023-04-05 NOTE — Therapy (Signed)
  OUTPATIENT PHYSICAL THERAPY SOZO SCREENING NOTE   Patient Name: Karina Wise MRN: [id number] DOB:1955-08-21, 67 y.o., female Today's Date: 04/05/2023  PCP: Gaspar Garbe, MD REFERRING PROVIDER: Griselda Miner, MD   PT End of Session - 04/05/23 [phone]     Visit Number 2   # unchanged due to screen only   PT Start Time 0841    PT Stop Time 0848    PT Time Calculation (min) 7 min    Activity Tolerance Patient tolerated treatment well    Behavior During Therapy [facility] for tasks assessed/performed             Past Medical History:  Diagnosis Date   Breast cancer (HCC) 08/05/2022   HPV (human papilloma virus) anogenital infection    Past Surgical History:  Procedure Laterality Date   APPENDECTOMY  1965   BREAST BIOPSY Left 2020   benign; [facility]   BREAST BIOPSY Left 08/05/2022   [place] LT BREAST BX W LOC DEV 1ST LESION IMG BX SPEC US GUIDE 08/05/2022 [facility]   BREAST BIOPSY  09/08/2022   MM LT RADIOACTIVE SEED LOC MAMMO GUIDE 09/08/2022 [facility]   BREAST LUMPECTOMY WITH RADIOACTIVE SEED AND SENTINEL LYMPH NODE BIOPSY Left 09/09/2022   Procedure: LEFT BREAST LUMPECTOMY WITH RADIOACTIVE SEED AND SENTINEL LYMPH NODE BIOPSY;  Surgeon: Griselda Miner, MD;  Location: [facility];  Service: General;  Laterality: Left;   Patient Active Problem List   Diagnosis Date Noted   Genetic testing 10/19/2022   Malignant neoplasm of upper-inner quadrant of left breast in female, estrogen receptor positive (HCC) 08/19/2022   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 4 negative nodes removed.  Will be having radiation Patient was diagnosed on 08/05/22 with left grade 2  invasive papillary carcinoma. It measures 1.6 cm and is located in the upper inner quadrant. It is ER/PR positive with a Ki67 of 5%.   PRECAUTIONS: left UE Lymphedema risk, None  SUBJECTIVE: Pt returns for her 3 month L-Dex screen.  PAIN:  Are you having pain? No  SOZO SCREENING: Patient was assessed today using the SOZO machine to determine the lymphedema index score. This was compared to her baseline score. It was determined that she is within the recommended range when compared to her baseline and no further action is needed at this time. She will continue SOZO screenings. These are done every 3 months for 2 years post operatively followed by every 6 months for 2 years, and then annually.   L-DEX FLOWSHEETS - 04/05/23 0800       L-DEX LYMPHEDEMA SCREENING   Measurement Type Unilateral    L-DEX MEASUREMENT EXTREMITY Upper Extremity    POSITION  Standing    DOMINANT SIDE Right    At Risk Side Left    BASELINE SCORE (UNILATERAL) 1.5    L-DEX SCORE (UNILATERAL) 4.1    VALUE CHANGE (UNILAT) 2.6               Hermenia Bers, PTA 04/05/2023, 8:50 AM

## 2023-07-05 ENCOUNTER — Ambulatory Visit: Payer: Medicare HMO | Attending: General Surgery

## 2023-07-05 VITALS — Wt 138.4 lb

## 2023-07-05 DIAGNOSIS — Z483 Aftercare following surgery for neoplasm: Secondary | ICD-10-CM | POA: Insufficient documentation

## 2023-07-05 NOTE — Therapy (Signed)
  OUTPATIENT PHYSICAL THERAPY SOZO SCREENING NOTE   Patient Name: Karina Wise MRN: [id number] DOB:January 24, 1956, 67 y.o., female Today's Date: 07/05/2023  PCP: Gaspar Garbe, MD REFERRING PROVIDER: Griselda Miner, MD   PT End of Session - 07/05/23 0850     Visit Number 2   # unchanged due to screen only   PT Start Time 0848    PT Stop Time 0852    PT Time Calculation (min) 4 min    Activity Tolerance Patient tolerated treatment well    Behavior During Therapy [facility] for tasks assessed/performed             Past Medical History:  Diagnosis Date   Breast cancer (HCC) 08/05/2022   HPV (human papilloma virus) anogenital infection    Past Surgical History:  Procedure Laterality Date   APPENDECTOMY  1965   BREAST BIOPSY Left 2020   benign; [facility]   BREAST BIOPSY Left 08/05/2022   [place] LT BREAST BX W LOC DEV 1ST LESION IMG BX SPEC US GUIDE 08/05/2022 [facility]   BREAST BIOPSY  09/08/2022   MM LT RADIOACTIVE SEED LOC MAMMO GUIDE 09/08/2022 [facility]   BREAST LUMPECTOMY WITH RADIOACTIVE SEED AND SENTINEL LYMPH NODE BIOPSY Left 09/09/2022   Procedure: LEFT BREAST LUMPECTOMY WITH RADIOACTIVE SEED AND SENTINEL LYMPH NODE BIOPSY;  Surgeon: Griselda Miner, MD;  Location: [facility];  Service: General;  Laterality: Left;   Patient Active Problem List   Diagnosis Date Noted   Genetic testing 10/19/2022   Malignant neoplasm of upper-inner quadrant of left breast in female, estrogen receptor positive (HCC) 08/19/2022   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 4 negative nodes removed.  Will be having radiation Patient was diagnosed on 08/05/22 with left grade 2  invasive papillary carcinoma. It measures 1.6 cm and is located in the upper inner quadrant. It is ER/PR positive with a Ki67 of 5%.   PRECAUTIONS: left UE Lymphedema risk, None  SUBJECTIVE: Pt returns for her 3 month L-Dex screen.  PAIN:  Are you having pain? No  SOZO SCREENING: Patient was assessed today using the SOZO machine to determine the lymphedema index score. This was compared to her baseline score. It was determined that she is within the recommended range when compared to her baseline and no further action is needed at this time. She will continue SOZO screenings. These are done every 3 months for 2 years post operatively followed by every 6 months for 2 years, and then annually.   L-DEX FLOWSHEETS - 07/05/23 0800       L-DEX LYMPHEDEMA SCREENING   Measurement Type Unilateral    L-DEX MEASUREMENT EXTREMITY Upper Extremity    POSITION  Standing    DOMINANT SIDE Right    At Risk Side Left    BASELINE SCORE (UNILATERAL) 1.5    L-DEX SCORE (UNILATERAL) 3.9    VALUE CHANGE (UNILAT) 2.4               Hermenia Bers, PTA 07/05/2023, 8:52 AM

## 2023-07-15 ENCOUNTER — Ambulatory Visit
Admission: RE | Admit: 2023-07-15 | Discharge: 2023-07-15 | Disposition: A | Discharge status: Home / Self Care | Payer: Medicare HMO | Source: Ambulatory Visit | Attending: Adult Health | Admitting: Adult Health

## 2023-07-15 DIAGNOSIS — C50212 Malignant neoplasm of upper-inner quadrant of left female breast: Secondary | ICD-10-CM

## 2023-07-15 DIAGNOSIS — Z853 Personal history of malignant neoplasm of breast: Secondary | ICD-10-CM | POA: Diagnosis not present

## 2023-08-30 ENCOUNTER — Inpatient Hospital Stay: Payer: Medicare HMO | Attending: Hematology and Oncology | Admitting: Hematology and Oncology

## 2023-08-30 VITALS — BP 170/79 | HR 98 | Temp 98.4°F | Resp 20 | Ht 65.0 in | Wt 141.2 lb

## 2023-08-30 DIAGNOSIS — Z79899 Other long term (current) drug therapy: Secondary | ICD-10-CM | POA: Diagnosis not present

## 2023-08-30 DIAGNOSIS — Z1721 Progesterone receptor positive status: Secondary | ICD-10-CM | POA: Insufficient documentation

## 2023-08-30 DIAGNOSIS — Z780 Asymptomatic menopausal state: Secondary | ICD-10-CM | POA: Diagnosis not present

## 2023-08-30 DIAGNOSIS — Z79811 Long term (current) use of aromatase inhibitors: Secondary | ICD-10-CM | POA: Diagnosis not present

## 2023-08-30 DIAGNOSIS — Z170 Estrogen receptor positive status [ER+]: Secondary | ICD-10-CM | POA: Diagnosis not present

## 2023-08-30 DIAGNOSIS — Z923 Personal history of irradiation: Secondary | ICD-10-CM | POA: Insufficient documentation

## 2023-08-30 DIAGNOSIS — Z1732 Human epidermal growth factor receptor 2 negative status: Secondary | ICD-10-CM | POA: Diagnosis not present

## 2023-08-30 DIAGNOSIS — I10 Essential (primary) hypertension: Secondary | ICD-10-CM | POA: Insufficient documentation

## 2023-08-30 DIAGNOSIS — C50212 Malignant neoplasm of upper-inner quadrant of left female breast: Secondary | ICD-10-CM | POA: Insufficient documentation

## 2023-08-30 MED ORDER — ANASTROZOLE 1 MG PO TABS: 1.0000 mg | ORAL_TABLET | Freq: Every day | ORAL | 3 refills | Status: DC

## 2023-08-30 NOTE — Assessment & Plan Note (Signed)
09/09/2022:Left lumpectomy: Grade 2 invasive papillary carcinoma 1.3 cm, low-grade DCIS, margins negative, 0/4 lymph nodes negative, ER 100%, PR 80%, HER2 0, Ki-67 5% Oncotype DX recurrence score: 2 (risk of distant recurrence: 3%)  Adjuvant radiation: 10/29/2022-11/25/2022   Treatment plan: Adjuvant antiestrogen therapy with anastrozole 1 mg daily x 5 years to start 12/08/2022   Anastrozole toxicities:  Breast cancer surveillance: Breast exam 08/30/2023: Benign Mammogram 07/15/2023: Benign breast density category C  Return to clinic in 1 year for follow-up

## 2023-08-30 NOTE — Progress Notes (Signed)
Patient Care Team: Tisovec, Adelfa Koh, MD as PCP - General (Internal Medicine) [name], MD as Consulting Physician (Hematology and Oncology) Dorothy Puffer, MD as Consulting Physician (Radiation Oncology) Griselda Miner, MD as Consulting Physician (General Surgery)  DIAGNOSIS:  Encounter Diagnoses  Name Primary?   Malignant neoplasm of upper-inner quadrant of left breast in female, estrogen receptor positive (HCC) Yes   Post-menopausal     SUMMARY OF ONCOLOGIC HISTORY: Oncology History  Malignant neoplasm of upper-inner quadrant of left breast in female, estrogen receptor positive (HCC)  08/05/2022 Initial Diagnosis   Screening mammogram detected left breast mass by ultrasound measured 1.6 cm, axilla negative, biopsy revealed grade 2 invasive papillary carcinoma ER 100%, PR 80%, Ki-67 5%, HER2 0 negative   08/20/2022 Cancer Staging   Staging form: Breast, AJCC 8th Edition - Clinical: Stage IA (cT1c, cN0, cM0, G2, ER+, PR+, HER2-) - Signed by [name], MD on 08/20/2022 Histologic grading system: 3 grade system   09/09/2022 Surgery   Left lumpectomy: Grade 2 invasive papillary carcinoma 1.3 cm, low-grade DCIS, margins negative, 0/4 lymph nodes negative, ER 100%, PR 80%, HER2 0, Ki-67 5%    Genetic Testing   Negative genetic testing. No pathogenic variants identified on the [facility] Multi-Cancer+RNA panel. The report date is 10/15/2022.  The Multi-Cancer + RNA Panel offered by [facility] includes sequencing and/or deletion/duplication analysis of the following 70 genes:  AIP*, ALK, APC*, ATM*, AXIN2*, BAP1*, BARD1*, BLM*, BMPR1A*, BRCA1*, BRCA2*, BRIP1*, CDC73*, CDH1*, CDK4, CDKN1B*, CDKN2A, CHEK2*, CTNNA1*, DICER1*, EPCAM, EGFR, FH*, FLCN*, GREM1, HOXB13, KIT, LZTR1, MAX*, MBD4, MEN1*, MET, MITF, MLH1*, MSH2*, MSH3*, MSH6*, MUTYH*, NF1*, NF2*, NTHL1*, PALB2*, PDGFRA, PMS2*, POLD1*, POLE*, POT1*, PRKAR1A*, PTCH1*, PTEN*, RAD51C*, RAD51D*, RB1*, RET, SDHA*, SDHAF2*, SDHB*, SDHC*, SDHD*,  SMAD4*, SMARCA4*, SMARCB1*, SMARCE1*, STK11*, SUFU*, TMEM127*, TP53*, TSC1*, TSC2*, VHL*. RNA analysis is performed for * genes.   10/29/2022 - 11/25/2022 Radiation Therapy   Adjuvant radiation therapy   11/2022 -  Anti-estrogen oral therapy   Anastrozole daily     CHIEF COMPLIANT:   HISTORY OF PRESENT ILLNESS: Discussed the use of AI scribe software for clinical note transcription with the patient, who gave verbal consent to proceed.  History of Present Illness             ALLERGIES:  has no known allergies.  MEDICATIONS:  Current Outpatient Medications  Medication Sig Dispense Refill   anastrozole (ARIMIDEX) 1 MG tablet Take 1 tablet (1 mg total) by mouth daily. 90 tablet 3   B Complex Vitamins (VITAMIN B COMPLEX PO) Take by mouth.     cholecalciferol (VITAMIN D3) 25 MCG (1000 UT) tablet Take 1,000 Units by mouth daily.     simvastatin (ZOCOR) 20 MG tablet Take 20 mg by mouth daily.     No current facility-administered medications for this visit.    PHYSICAL EXAMINATION: ECOG PERFORMANCE STATUS: 1 - Symptomatic but completely ambulatory  Vitals:   08/30/23 0841  BP: (!) 170/79  Pulse: 98  Resp: 20  Temp: 98.4 F (36.9 C)  SpO2: 99%   Filed Weights   08/30/23 0841  Weight: 141 lb 3.2 oz (64 kg)      ASSESSMENT & PLAN:  Malignant neoplasm of upper-inner quadrant of left breast in female, estrogen receptor positive (HCC) 09/09/2022:Left lumpectomy: Grade 2 invasive papillary carcinoma 1.3 cm, low-grade DCIS, margins negative, 0/4 lymph nodes negative, ER 100%, PR 80%, HER2 0, Ki-67 5% Oncotype DX recurrence score: 2 (risk of distant recurrence: 3%)  Adjuvant radiation:  10/29/2022-11/25/2022   Treatment plan: Adjuvant antiestrogen therapy with anastrozole 1 mg daily x 5 years to start 12/08/2022   Anastrozole toxicities: Muscle aches and pains Hypertension: Patient is going to see her primary care physician Dr. Wylene Simmer to discuss her blood pressure issues. I ordered  a bone density test.  Breast cancer surveillance: Breast exam 08/30/2023: Benign Mammogram 07/15/2023: Benign breast density category C  She enjoys playing golf at [facility]. Return to clinic in 1 year for follow-up     Orders Placed This Encounter  Procedures   DG Bone Density    Standing Status:   Future    Expected Date:   09/27/2023    Expiration Date:   08/29/2024    Reason for Exam (SYMPTOM  OR DIAGNOSIS REQUIRED):   Post menopausal    Preferred imaging [facility]?:   [facility]    Release to patient:   Immediate   The patient has a good understanding of the overall plan. she agrees with it. she will call with any problems that may develop before the next visit here. Total time spent: 30 mins including face to face time and time spent for planning, charting and co-ordination of care   Tamsen Meek, MD 08/30/23

## 2023-09-09 ENCOUNTER — Other Ambulatory Visit: Payer: Self-pay | Admitting: Adult Health

## 2023-09-09 ENCOUNTER — Other Ambulatory Visit: Payer: Self-pay | Admitting: *Deleted

## 2023-09-09 ENCOUNTER — Telehealth: Payer: Self-pay

## 2023-09-09 DIAGNOSIS — Z9189 Other specified personal risk factors, not elsewhere classified: Secondary | ICD-10-CM

## 2023-09-09 DIAGNOSIS — C50212 Malignant neoplasm of upper-inner quadrant of left female breast: Secondary | ICD-10-CM

## 2023-09-09 NOTE — Telephone Encounter (Signed)
Pt called in to see if she could get referral to Dr. Burley Saver and to see if she could get an order for her Mri that rad recommended. Put in orders for pt and informed her that the breast center will be reaching out to schedule. Pt verbalized understanding.

## 2023-09-09 NOTE — Progress Notes (Signed)
Pt recent mammogram requesting pt undergo breast MRI in 6 months due to pt being high risk.  RN reviewed with NP and verbal orders received and placed.  Pt notified to contact GI to scheduled and verbalized understanding.

## 2023-09-10 DIAGNOSIS — E559 Vitamin D deficiency, unspecified: Secondary | ICD-10-CM | POA: Diagnosis not present

## 2023-09-10 DIAGNOSIS — E7800 Pure hypercholesterolemia, unspecified: Secondary | ICD-10-CM | POA: Diagnosis not present

## 2023-09-10 DIAGNOSIS — Z1212 Encounter for screening for malignant neoplasm of rectum: Secondary | ICD-10-CM | POA: Diagnosis not present

## 2023-09-17 DIAGNOSIS — Z1339 Encounter for screening examination for other mental health and behavioral disorders: Secondary | ICD-10-CM | POA: Diagnosis not present

## 2023-09-17 DIAGNOSIS — E559 Vitamin D deficiency, unspecified: Secondary | ICD-10-CM | POA: Diagnosis not present

## 2023-09-17 DIAGNOSIS — D126 Benign neoplasm of colon, unspecified: Secondary | ICD-10-CM | POA: Diagnosis not present

## 2023-09-17 DIAGNOSIS — E7800 Pure hypercholesterolemia, unspecified: Secondary | ICD-10-CM | POA: Diagnosis not present

## 2023-09-17 DIAGNOSIS — Z1331 Encounter for screening for depression: Secondary | ICD-10-CM | POA: Diagnosis not present

## 2023-09-17 DIAGNOSIS — C50212 Malignant neoplasm of upper-inner quadrant of left female breast: Secondary | ICD-10-CM | POA: Diagnosis not present

## 2023-09-17 DIAGNOSIS — R85612 Low grade squamous intraepithelial lesion on cytologic smear of anus (LGSIL): Secondary | ICD-10-CM | POA: Diagnosis not present

## 2023-09-17 DIAGNOSIS — Z0000 Encounter for general adult medical examination without abnormal findings: Secondary | ICD-10-CM | POA: Diagnosis not present

## 2023-09-17 DIAGNOSIS — R82998 Other abnormal findings in urine: Secondary | ICD-10-CM | POA: Diagnosis not present

## 2023-10-04 ENCOUNTER — Ambulatory Visit: Payer: Medicare HMO | Attending: General Surgery

## 2023-10-04 VITALS — Wt 141.0 lb

## 2023-10-04 DIAGNOSIS — Z483 Aftercare following surgery for neoplasm: Secondary | ICD-10-CM | POA: Insufficient documentation

## 2023-10-04 NOTE — Therapy (Signed)
  OUTPATIENT PHYSICAL THERAPY SOZO SCREENING NOTE   Patient Name: Karina Wise MRN: [id number] DOB:1955/12/20, 68 y.o., female Today's Date: 10/04/2023  PCP: Gaspar Garbe, MD REFERRING PROVIDER: Gaspar Garbe, MD   PT End of Session - 10/04/23 [phone]     Visit Number 2   # unchanged due to screen only   PT Start Time 0907    PT Stop Time 0911    PT Time Calculation (min) 4 min    Activity Tolerance Patient tolerated treatment well    Behavior During Therapy [facility] for tasks assessed/performed             Past Medical History:  Diagnosis Date   Breast cancer (HCC) 08/05/2022   HPV (human papilloma virus) anogenital infection    Past Surgical History:  Procedure Laterality Date   APPENDECTOMY  1965   BREAST BIOPSY Left 2020   benign; [facility]   BREAST BIOPSY Left 08/05/2022   [place] LT BREAST BX W LOC DEV 1ST LESION IMG BX SPEC US GUIDE 08/05/2022 [facility]   BREAST BIOPSY  09/08/2022   MM LT RADIOACTIVE SEED LOC MAMMO GUIDE 09/08/2022 [facility]   BREAST LUMPECTOMY WITH RADIOACTIVE SEED AND SENTINEL LYMPH NODE BIOPSY Left 09/09/2022   Procedure: LEFT BREAST LUMPECTOMY WITH RADIOACTIVE SEED AND SENTINEL LYMPH NODE BIOPSY;  Surgeon: Griselda Miner, MD;  Location: [facility];  Service: General;  Laterality: Left;   Patient Active Problem List   Diagnosis Date Noted   Genetic testing 10/19/2022   Malignant neoplasm of upper-inner quadrant of left breast in female, estrogen receptor positive (HCC) 08/19/2022   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 4 negative nodes removed.  Will be having radiation Patient was diagnosed on 08/05/22 with left grade 2  invasive papillary carcinoma. It measures 1.6 cm and is located in the upper inner quadrant. It is ER/PR positive with a Ki67 of 5%.   PRECAUTIONS: left UE Lymphedema risk, None  SUBJECTIVE: Pt returns for her 3 month L-Dex screen.  PAIN:  Are you having pain? No  SOZO SCREENING: Patient was assessed today using the SOZO machine to determine the lymphedema index score. This was compared to her baseline score. It was determined that she is within the recommended range when compared to her baseline and no further action is needed at this time. She will continue SOZO screenings. These are done every 3 months for 2 years post operatively followed by every 6 months for 2 years, and then annually.   L-DEX FLOWSHEETS - 10/04/23 0900       L-DEX LYMPHEDEMA SCREENING   Measurement Type Unilateral    L-DEX MEASUREMENT EXTREMITY Upper Extremity    POSITION  Standing    DOMINANT SIDE Right    At Risk Side Left    BASELINE SCORE (UNILATERAL) 1.5    L-DEX SCORE (UNILATERAL) 2    VALUE CHANGE (UNILAT) 0.5               Hermenia Bers, PTA 10/04/2023, 9:11 AM

## 2023-12-06 ENCOUNTER — Encounter: Payer: Self-pay | Admitting: Adult Health

## 2023-12-22 ENCOUNTER — Encounter: Payer: Self-pay | Admitting: Adult Health

## 2024-01-03 ENCOUNTER — Ambulatory Visit: Attending: General Surgery

## 2024-01-03 VITALS — Wt 138.0 lb

## 2024-01-03 DIAGNOSIS — Z483 Aftercare following surgery for neoplasm: Secondary | ICD-10-CM | POA: Insufficient documentation

## 2024-01-03 NOTE — Therapy (Signed)
 OUTPATIENT PHYSICAL THERAPY SOZO SCREENING NOTE   Patient Name: Karina Wise MRN: [id number] DOB:1956/01/23, 68 y.o., female Today's Date: 01/03/2024  PCP: Suzzanne Estrin, MD REFERRING PROVIDER: Caralyn Chandler, MD   PT End of Session - 01/03/24 [phone]     Visit Number 2   # unchanged due to screen only   PT Start Time 0855    PT Stop Time 0859    PT Time Calculation (min) 4 min    Activity Tolerance Patient tolerated treatment well    Behavior During Therapy [facility] for tasks assessed/performed             Past Medical History:  Diagnosis Date   Breast cancer (HCC) 08/05/2022   HPV (human papilloma virus) anogenital infection    Past Surgical History:  Procedure Laterality Date   APPENDECTOMY  1965   BREAST BIOPSY Left 2020   benign; [facility]   BREAST BIOPSY Left 08/05/2022   US  LT BREAST BX W LOC DEV 1ST LESION IMG BX SPEC US  GUIDE 08/05/2022 [facility]   BREAST BIOPSY  09/08/2022   MM LT RADIOACTIVE SEED LOC MAMMO GUIDE 09/08/2022 [facility]   BREAST LUMPECTOMY WITH RADIOACTIVE SEED AND SENTINEL LYMPH NODE BIOPSY Left 09/09/2022   Procedure: LEFT BREAST LUMPECTOMY WITH RADIOACTIVE SEED AND SENTINEL LYMPH NODE BIOPSY;  Surgeon: Caralyn Chandler, MD;  Location: [facility];  Service: General;  Laterality: Left;   Patient Active Problem List   Diagnosis Date Noted   Genetic testing 10/19/2022   Malignant neoplasm of upper-inner quadrant of left breast in female, estrogen receptor positive (HCC) 08/19/2022   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 4 negative nodes removed.  Will be having radiation Patient was diagnosed on 08/05/22 with left grade 2  invasive papillary carcinoma. It measures 1.6 cm and is located in the upper inner quadrant. It is ER/PR positive with a Ki67 of 5%.   PRECAUTIONS: left UE Lymphedema risk, None  SUBJECTIVE: Pt returns for her 3 month L-Dex screen. "I've been having trouble reaching behind my head lately."  PAIN:  Are you having pain? No  SOZO SCREENING: Patient was assessed today using the SOZO machine to determine the lymphedema index score. This was compared to her baseline score. It was determined that she is within the recommended range when compared to her baseline and no further action is needed at this time. She will continue SOZO screenings. These are done every 3 months for 2 years post operatively followed by every 6 months for 2 years, and then annually.  Educated pt to resume her post op stretches as she has stopped doing them and that this should help her regain her end ROM. If not, let her MD know she needs therapy if she doesn't notice reasonable improvements over next 2 weeks. Pt verbalized understanding and agreed.    L-DEX FLOWSHEETS - 01/03/24 0800       L-DEX LYMPHEDEMA SCREENING   Measurement Type Unilateral    L-DEX MEASUREMENT EXTREMITY Upper Extremity    POSITION  Standing    DOMINANT SIDE Right    At Risk Side Left    BASELINE SCORE (UNILATERAL) 1.5    L-DEX SCORE (UNILATERAL) 1.2  VALUE CHANGE (UNILAT) -0.3               Denyce Flank, PTA 01/03/2024, 9:00 AM

## 2024-01-17 ENCOUNTER — Ambulatory Visit
Admission: RE | Admit: 2024-01-17 | Discharge: 2024-01-17 | Disposition: A | Discharge status: Home / Self Care | Payer: Medicare HMO | Source: Ambulatory Visit | Attending: Adult Health | Admitting: Adult Health

## 2024-01-17 DIAGNOSIS — Z853 Personal history of malignant neoplasm of breast: Secondary | ICD-10-CM | POA: Diagnosis not present

## 2024-01-17 DIAGNOSIS — Z9189 Other specified personal risk factors, not elsewhere classified: Secondary | ICD-10-CM

## 2024-01-17 DIAGNOSIS — Z1239 Encounter for other screening for malignant neoplasm of breast: Secondary | ICD-10-CM | POA: Diagnosis not present

## 2024-01-17 DIAGNOSIS — C50212 Malignant neoplasm of upper-inner quadrant of left female breast: Secondary | ICD-10-CM

## 2024-01-17 MED ORDER — GADOPICLENOL 0.5 MMOL/ML IV SOLN
6.0000 mL | Freq: Once | INTRAVENOUS | Status: AC | PRN
Start: 2024-01-17 — End: 2024-01-17
  Administered 2024-01-17: 6 mL via INTRAVENOUS

## 2024-01-24 ENCOUNTER — Ambulatory Visit: Payer: Self-pay | Admitting: *Deleted

## 2024-03-23 DIAGNOSIS — H2513 Age-related nuclear cataract, bilateral: Secondary | ICD-10-CM | POA: Diagnosis not present

## 2024-03-23 DIAGNOSIS — H5203 Hypermetropia, bilateral: Secondary | ICD-10-CM | POA: Diagnosis not present

## 2024-03-23 DIAGNOSIS — H43813 Vitreous degeneration, bilateral: Secondary | ICD-10-CM | POA: Diagnosis not present

## 2024-03-23 DIAGNOSIS — H52203 Unspecified astigmatism, bilateral: Secondary | ICD-10-CM | POA: Diagnosis not present

## 2024-03-23 DIAGNOSIS — H25013 Cortical age-related cataract, bilateral: Secondary | ICD-10-CM | POA: Diagnosis not present

## 2024-03-28 DIAGNOSIS — C50212 Malignant neoplasm of upper-inner quadrant of left female breast: Secondary | ICD-10-CM | POA: Diagnosis not present

## 2024-03-28 DIAGNOSIS — Z170 Estrogen receptor positive status [ER+]: Secondary | ICD-10-CM | POA: Diagnosis not present

## 2024-04-17 ENCOUNTER — Ambulatory Visit: Attending: General Surgery

## 2024-04-17 VITALS — Wt 140.1 lb

## 2024-04-17 DIAGNOSIS — Z483 Aftercare following surgery for neoplasm: Secondary | ICD-10-CM | POA: Insufficient documentation

## 2024-04-17 NOTE — Therapy (Signed)
  OUTPATIENT PHYSICAL THERAPY SOZO SCREENING NOTE   Patient Name: Karina Wise MRN: [id number] DOB:05-19-1956, 68 y.o., female Today's Date: 04/17/2024  PCP: Vernadine Charlie ORN, MD REFERRING PROVIDER: Curvin Deward MOULD, MD   PT End of Session - 04/17/24 [phone]     Visit Number 2   # unchanged due to screen only   PT Start Time 0907    PT Stop Time 0911    PT Time Calculation (min) 4 min    Activity Tolerance Patient tolerated treatment well    Behavior During Therapy [facility] for tasks assessed/performed          Past Medical History:  Diagnosis Date   Breast cancer (HCC) 08/05/2022   HPV (human papilloma virus) anogenital infection    Past Surgical History:  Procedure Laterality Date   APPENDECTOMY  1965   BREAST BIOPSY Left 2020   benign; [facility]   BREAST BIOPSY Left 08/05/2022   US  LT BREAST BX W LOC DEV 1ST LESION IMG BX SPEC US  GUIDE 08/05/2022 [facility]   BREAST BIOPSY  09/08/2022   MM LT RADIOACTIVE SEED LOC MAMMO GUIDE 09/08/2022 [facility]   BREAST LUMPECTOMY WITH RADIOACTIVE SEED AND SENTINEL LYMPH NODE BIOPSY Left 09/09/2022   Procedure: LEFT BREAST LUMPECTOMY WITH RADIOACTIVE SEED AND SENTINEL LYMPH NODE BIOPSY;  Surgeon: Curvin Deward MOULD, MD;  [facility];  Service: General;  Laterality: Left;   Patient Active Problem List   Diagnosis Date Noted   Genetic testing 10/19/2022   Malignant neoplasm of upper-inner quadrant of left breast in female, estrogen receptor positive (HCC) 08/19/2022   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 4 negative nodes removed.  Will be having radiation Patient was diagnosed on 08/05/22 with left grade 2 invasive  papillary carcinoma. It measures 1.6 cm and is located in the upper inner quadrant. It is ER/PR positive with a Ki67 of 5%.   PRECAUTIONS: left UE Lymphedema risk, None  SUBJECTIVE: Pt returns for her 3 month L-Dex screen.   PAIN:  Are you having pain? No  SOZO SCREENING: Patient was assessed today using the SOZO machine to determine the lymphedema index score. This was compared to her baseline score. It was determined that she is within the recommended range when compared to her baseline and no further action is needed at this time. She will continue SOZO screenings. These are done every 3 months for 2 years post operatively followed by every 6 months for 2 years, and then annually.    L-DEX FLOWSHEETS - 04/17/24 0900       L-DEX LYMPHEDEMA SCREENING   Measurement Type Unilateral    L-DEX MEASUREMENT EXTREMITY Upper Extremity    POSITION  Standing    DOMINANT SIDE Right    At Risk Side Left    BASELINE SCORE (UNILATERAL) 1.5    L-DEX SCORE (UNILATERAL) 2.1    VALUE CHANGE (UNILAT) 0.6          P: May want to start 6 month screens next.   Aden Berwyn Caldron, PTA 04/17/2024, 9:10 AM

## 2024-04-24 ENCOUNTER — Ambulatory Visit (HOSPITAL_BASED_OUTPATIENT_CLINIC_OR_DEPARTMENT_OTHER)
Admission: RE | Admit: 2024-04-24 | Discharge: 2024-04-24 | Disposition: A | Discharge status: Home / Self Care | Source: Ambulatory Visit | Attending: Hematology and Oncology | Admitting: Hematology and Oncology

## 2024-04-24 ENCOUNTER — Other Ambulatory Visit: Payer: Medicare HMO

## 2024-04-24 DIAGNOSIS — Z780 Asymptomatic menopausal state: Secondary | ICD-10-CM | POA: Insufficient documentation

## 2024-05-31 ENCOUNTER — Other Ambulatory Visit: Payer: Self-pay | Admitting: Hematology and Oncology

## 2024-05-31 DIAGNOSIS — Z853 Personal history of malignant neoplasm of breast: Secondary | ICD-10-CM

## 2024-07-17 ENCOUNTER — Ambulatory Visit: Attending: General Surgery

## 2024-07-17 VITALS — Wt 141.4 lb

## 2024-07-17 DIAGNOSIS — Z483 Aftercare following surgery for neoplasm: Secondary | ICD-10-CM | POA: Insufficient documentation

## 2024-07-17 NOTE — Therapy (Signed)
" °  OUTPATIENT PHYSICAL THERAPY SOZO SCREENING NOTE   Patient Name: Karina Wise MRN: [id number] DOB:1955/08/02, 68 y.o., female Today's Date: 07/17/2024  PCP: Vernadine Charlie ORN, MD REFERRING PROVIDER: Curvin Deward MOULD, MD   PT End of Session - 07/17/24 [phone]     Visit Number 2   # unchanged due to screen only   PT Start Time 0904    PT Stop Time 0907    PT Time Calculation (min) 3 min    Activity Tolerance Patient tolerated treatment well    Behavior During Therapy [facility] for tasks assessed/performed          Past Medical History:  Diagnosis Date   Breast cancer (HCC) 08/05/2022   HPV (human papilloma virus) anogenital infection    Past Surgical History:  Procedure Laterality Date   APPENDECTOMY  1965   BREAST BIOPSY Left 2020   benign; [facility]   BREAST BIOPSY Left 08/05/2022   US  LT BREAST BX W LOC DEV 1ST LESION IMG BX SPEC US  GUIDE 08/05/2022 [facility]MO[facility]   BREAST BIOPSY  09/08/2022   MM LT RADIOACTIVE SEED LOC MAMMO GUIDE 09/08/2022 [facility] MAMMOGRAP[facility]   BREAST LUMPECTOMY WITH RADIOACTIVE SEED AND SENTINEL LYMPH NODE BIOPSY Left 09/09/2022   Procedure: LEFT BREAST LUMPECTOMY WITH RADIOACTIVE SEED AND SENTINEL LYMPH NODE BIOPSY;  Surgeon: Curvin Deward MOULD, MD;  Location: [facility];  Service: General;  Laterality: Left;   Patient Active Problem List   Diagnosis Date Noted   Genetic testing 10/19/2022   Malignant neoplasm of upper-inner quadrant of left breast in female, estrogen receptor positive (HCC) 08/19/2022   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 4 negative nodes removed.  Will be having radiation Patient was diagnosed on 08/05/22 with left grade 2 invasive  papillary carcinoma. It measures 1.6 cm and is located in the upper inner quadrant. It is ER/PR positive with a Ki67 of 5%.   PRECAUTIONS: left UE Lymphedema risk, None  SUBJECTIVE: Pt returns for her 3 month L-Dex screen.   PAIN:  Are you having pain? No  SOZO SCREENING: Patient was assessed today using the SOZO machine to determine the lymphedema index score. This was compared to her baseline score. It was determined that she is within the recommended range when compared to her baseline and no further action is needed at this time. She will continue SOZO screenings. These are done every 3 months for 2 years post operatively followed by every 6 months for 2 years, and then annually.    L-DEX FLOWSHEETS - 07/17/24 0900       L-DEX LYMPHEDEMA SCREENING   Measurement Type Unilateral    L-DEX MEASUREMENT EXTREMITY Upper Extremity    POSITION  Standing    DOMINANT SIDE Right    At Risk Side Left    BASELINE SCORE (UNILATERAL) 1.5    L-DEX SCORE (UNILATERAL) 1.9    VALUE CHANGE (UNILAT) 0.4          P: Begin 6 month L-Dex screens until 08/2026, then can transition to annual.   Aden Berwyn Caldron, PTA 07/17/2024, 9:09 AM     "

## 2024-07-18 ENCOUNTER — Encounter

## 2024-07-18 ENCOUNTER — Ambulatory Visit
Admission: RE | Admit: 2024-07-18 | Discharge: 2024-07-18 | Disposition: A | Discharge status: Home / Self Care | Source: Ambulatory Visit | Attending: Hematology and Oncology | Admitting: Hematology and Oncology

## 2024-07-18 DIAGNOSIS — Z853 Personal history of malignant neoplasm of breast: Secondary | ICD-10-CM

## 2024-07-18 DIAGNOSIS — R928 Other abnormal and inconclusive findings on diagnostic imaging of breast: Secondary | ICD-10-CM | POA: Diagnosis not present

## 2024-08-30 ENCOUNTER — Inpatient Hospital Stay: Payer: Medicare HMO | Admitting: Hematology and Oncology

## 2024-08-30 VITALS — BP 140/90 | HR 105 | Temp 98.0°F | Resp 18 | Wt 138.0 lb

## 2024-08-30 DIAGNOSIS — Z170 Estrogen receptor positive status [ER+]: Secondary | ICD-10-CM | POA: Diagnosis not present

## 2024-08-30 DIAGNOSIS — C50212 Malignant neoplasm of upper-inner quadrant of left female breast: Secondary | ICD-10-CM

## 2024-08-30 MED ORDER — ANASTROZOLE 1 MG PO TABS: 1.0000 mg | ORAL_TABLET | Freq: Every day | ORAL | 3 refills | Status: AC

## 2024-08-30 NOTE — Assessment & Plan Note (Signed)
 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 Tisovec to discuss her blood pressure issues. I ordered a bone density test.   Breast cancer surveillance: Breast exam 08/30/2024: Benign Mammogram 07/18/2024: Benign breast density category C Bone density 04/24/2024: T-score -1.6: Osteopenia recommend calcium vitamin D and weightbearing exercises   She enjoys playing golf at [facility]. Return to clinic in 1 year for follow-up

## 2024-08-30 NOTE — Progress Notes (Signed)
 "  Patient Care Team: Tisovec, Charlie ORN, MD as PCP - General (Internal Medicine) Odean Potts, MD as Consulting Physician (Hematology and Oncology) Dewey Rush, MD as Consulting Physician (Radiation Oncology) Curvin Deward MOULD, MD as Consulting Physician (General Surgery)  DIAGNOSIS:  Encounter Diagnosis  Name Primary?   Malignant neoplasm of upper-inner quadrant of left breast in female, estrogen receptor positive (HCC) Yes    SUMMARY OF ONCOLOGIC HISTORY: Oncology History  Malignant neoplasm of upper-inner quadrant of left breast in female, estrogen receptor positive (HCC)  08/05/2022 Initial Diagnosis   Screening mammogram detected left breast mass by ultrasound measured 1.6 cm, axilla negative, biopsy revealed grade 2 invasive papillary carcinoma ER 100%, PR 80%, Ki-67 5%, HER2 0 negative   08/20/2022 Cancer Staging   Staging form: Breast, AJCC 8th Edition - Clinical: Stage IA (cT1c, cN0, cM0, G2, ER+, PR+, HER2-) - Signed by Odean Potts, MD on 08/20/2022 Histologic grading system: 3 grade system   09/09/2022 Surgery   Left lumpectomy: Grade 2 invasive papillary carcinoma 1.3 cm, low-grade DCIS, margins negative, 0/4 lymph nodes negative, ER 100%, PR 80%, HER2 0, Ki-67 5%    Genetic Testing   Negative genetic testing. No pathogenic variants identified on the [facility] Multi-Cancer+RNA panel. The report date is 10/15/2022.  The Multi-Cancer + RNA Panel offered by [facility] includes sequencing and/or deletion/duplication analysis of the following 70 genes:  AIP*, ALK, APC*, ATM*, AXIN2*, BAP1*, BARD1*, BLM*, BMPR1A*, BRCA1*, BRCA2*, BRIP1*, CDC73*, CDH1*, CDK4, CDKN1B*, CDKN2A, CHEK2*, CTNNA1*, DICER1*, EPCAM, EGFR, FH*, FLCN*, GREM1, HOXB13, KIT, LZTR1, MAX*, MBD4, MEN1*, MET, MITF, MLH1*, MSH2*, MSH3*, MSH6*, MUTYH*, NF1*, NF2*, NTHL1*, PALB2*, PDGFRA, PMS2*, POLD1*, POLE*, POT1*, PRKAR1A*, PTCH1*, PTEN*, RAD51C*, RAD51D*, RB1*, RET, SDHA*, SDHAF2*, SDHB*, SDHC*, SDHD*, SMAD4*, SMARCA4*,  SMARCB1*, SMARCE1*, STK11*, SUFU*, TMEM127*, TP53*, TSC1*, TSC2*, VHL*. RNA analysis is performed for * genes.   10/29/2022 - 11/25/2022 Radiation Therapy   Adjuvant radiation therapy   11/2022 -  Anti-estrogen oral therapy   Anastrozole  daily     CHIEF COMPLIANT: Follow-up on anastrozole  therapy  HISTORY OF PRESENT ILLNESS:  History of Present Illness Karina Wise is a 69 year old female with stage IA ER+ invasive papillary carcinoma of the left breast, status post lumpectomy and adjuvant radiation, presenting for routine oncology follow-up two years post-surgery.  She is two years post left breast lumpectomy with sentinel lymph node biopsy (0/4 negative) and adjuvant radiation for invasive papillary carcinoma with associated low-grade DCIS. She has only occasional brief, non-bothersome twinges in the surgical area and no persistent pain, new breast symptoms, or other concerning changes.  She takes anastrozole  daily as adjuvant endocrine therapy without significant limitation in activity. She has mild, manageable body aches that she attributes to the medication and aging. She has not noticed other notable side effects.  Recent bone density testing showed mild osteopenia with a T-score of -1.6 at the left hip neck. She takes vitamin D and has had no bone pain or fractures.  She asked about the need for repeat breast MRI in the setting of category C breast density and requested a refill of anastrozole . She has no other new concerns.      ALLERGIES:  has no known allergies.  MEDICATIONS:  Current Outpatient Medications  Medication Sig Dispense Refill   anastrozole  (ARIMIDEX ) 1 MG tablet Take 1 tablet (1 mg total) by mouth daily. 90 tablet 3   B Complex Vitamins (VITAMIN B COMPLEX PO) Take by mouth.     cholecalciferol (VITAMIN D3) 25 MCG (1000  UT) tablet Take 1,000 Units by mouth daily.     simvastatin (ZOCOR) 20 MG tablet Take 20 mg by mouth daily.     No current  facility-administered medications for this visit.    PHYSICAL EXAMINATION: ECOG PERFORMANCE STATUS: 1 - Symptomatic but completely ambulatory  Vitals:   08/30/24 0839  BP: (!) 140/90  Pulse: (!) 105  Resp: 18  Temp: 98 F (36.7 C)  SpO2: 98%   Filed Weights   08/30/24 0839  Weight: 138 lb (62.6 kg)     ASSESSMENT & PLAN:  Malignant neoplasm of upper-inner quadrant of left breast in female, estrogen receptor positive (HCC) 09/09/2022:Left lumpectomy: Grade 2 invasive papillary carcinoma 1.3 cm, low-grade DCIS, margins negative, 0/4 lymph nodes negative, ER 100%, PR 80%, HER2 0, Ki-67 5% Oncotype DX recurrence score: 2 (risk of distant recurrence: 3%)  Adjuvant radiation: 10/29/2022-11/25/2022   Treatment plan: Adjuvant antiestrogen therapy with anastrozole  1 mg daily x 5 years to start 12/08/2022   Anastrozole  toxicities: Muscle aches and pains Hypertension: Patient is going to see her primary care physician Dr. Tisovec to discuss her blood pressure issues. I ordered a bone density test.   Breast cancer surveillance: Breast exam 08/30/2024: Benign Mammogram 07/18/2024: Benign breast density category C Bone density 04/24/2024: T-score -1.6: Osteopenia recommend calcium vitamin D and weightbearing exercises   Breast MRI every 2 to 3 years She enjoys playing golf at [facility]. Return to clinic in 1 year for follow-up ------------------------------------- Assessment and Plan Assessment & Plan Osteopenia Mild osteopenia (T-score -1.6 at left hip neck) likely due to anastrozole  and post-menopausal status. Other bone sites normal or above average. No osteoporosis. Bone density loss monitored as long-term effect of therapy. - Continue bone density monitoring during anastrozole  therapy. - Recommend continuation of vitamin D supplementation, especially in winter. - Encourage ongoing physical activity.      No orders of the defined types were placed in this encounter.  The  patient has a good understanding of the overall plan. she agrees with it. she will call with any problems that may develop before the next visit here.  I personally spent a total of 30 minutes in the care of the patient today including preparing to see the patient, getting/reviewing separately obtained history, performing a medically appropriate exam/evaluation, counseling and educating, placing orders, referring and communicating with other health care professionals, documenting clinical information in the EHR, independently interpreting results, communicating results, and coordinating care.   Dr.Alica Shellhammer 08/30/24    "

## 2025-01-29 ENCOUNTER — Ambulatory Visit: Attending: General Surgery

## 2025-08-30 ENCOUNTER — Inpatient Hospital Stay: Admitting: Hematology and Oncology
